# Patient Record
Sex: Female | Born: 1970 | Race: White | Hispanic: No | Smoking: Current every day smoker
Health system: Southern US, Community
[De-identification: ages and names within clinical notes are randomized; demographics above are authoritative.]

## PROBLEM LIST (undated history)

## (undated) DIAGNOSIS — F419 Anxiety disorder, unspecified: Secondary | ICD-10-CM

## (undated) DIAGNOSIS — F988 Other specified behavioral and emotional disorders with onset usually occurring in childhood and adolescence: Secondary | ICD-10-CM

## (undated) DIAGNOSIS — K219 Gastro-esophageal reflux disease without esophagitis: Secondary | ICD-10-CM

## (undated) DIAGNOSIS — Z973 Presence of spectacles and contact lenses: Secondary | ICD-10-CM

## (undated) DIAGNOSIS — F32A Depression, unspecified: Secondary | ICD-10-CM

## (undated) HISTORY — DX: Other specified behavioral and emotional disorders with onset usually occurring in childhood and adolescence: F98.8

## (undated) HISTORY — PX: WISDOM TOOTH EXTRACTION: SHX21

## (undated) HISTORY — DX: Gastro-esophageal reflux disease without esophagitis: K21.9

## (undated) HISTORY — DX: Depression, unspecified: F32.A

## (undated) HISTORY — DX: Anxiety disorder, unspecified: F41.9

---

## 2003-06-09 ENCOUNTER — Other Ambulatory Visit: Admission: RE | Admit: 2003-06-09 | Discharge: 2003-06-09 | Payer: Self-pay | Admitting: Obstetrics and Gynecology

## 2004-04-20 ENCOUNTER — Inpatient Hospital Stay (HOSPITAL_COMMUNITY): Admission: AD | Admit: 2004-04-20 | Discharge: 2004-04-20 | Payer: Self-pay | Admitting: Obstetrics and Gynecology

## 2004-04-21 ENCOUNTER — Observation Stay (HOSPITAL_COMMUNITY): Admission: AD | Admit: 2004-04-21 | Discharge: 2004-04-22 | Payer: Self-pay | Admitting: Obstetrics & Gynecology

## 2004-06-24 ENCOUNTER — Inpatient Hospital Stay (HOSPITAL_COMMUNITY): Admission: AD | Admit: 2004-06-24 | Discharge: 2004-06-28 | Payer: Self-pay | Admitting: Obstetrics and Gynecology

## 2004-08-09 ENCOUNTER — Other Ambulatory Visit: Admission: RE | Admit: 2004-08-09 | Discharge: 2004-08-09 | Payer: Self-pay | Admitting: Obstetrics and Gynecology

## 2005-04-26 ENCOUNTER — Ambulatory Visit: Payer: Self-pay | Admitting: Internal Medicine

## 2005-09-01 ENCOUNTER — Ambulatory Visit: Payer: Self-pay | Admitting: Internal Medicine

## 2006-09-18 ENCOUNTER — Encounter: Payer: Self-pay | Admitting: Internal Medicine

## 2006-10-09 ENCOUNTER — Ambulatory Visit: Payer: Self-pay | Admitting: Internal Medicine

## 2006-10-09 DIAGNOSIS — R609 Edema, unspecified: Secondary | ICD-10-CM | POA: Insufficient documentation

## 2006-10-09 DIAGNOSIS — K219 Gastro-esophageal reflux disease without esophagitis: Secondary | ICD-10-CM | POA: Insufficient documentation

## 2006-10-09 LAB — CONVERTED CEMR LAB
ALT: 17 units/L (ref 0–35)
AST: 16 units/L (ref 0–37)
Albumin: 3.4 g/dL — ABNORMAL LOW (ref 3.5–5.2)
Alkaline Phosphatase: 28 units/L — ABNORMAL LOW (ref 39–117)
BUN: 16 mg/dL (ref 6–23)
Basophils Absolute: 0 10*3/uL (ref 0.0–0.1)
Basophils Relative: 0.3 % (ref 0.0–1.0)
Bilirubin, Direct: 0.2 mg/dL (ref 0.0–0.3)
CO2: 30 meq/L (ref 19–32)
Calcium: 8.8 mg/dL (ref 8.4–10.5)
Chloride: 103 meq/L (ref 96–112)
Creatinine, Ser: 0.9 mg/dL (ref 0.4–1.2)
Eosinophils Absolute: 0.1 10*3/uL (ref 0.0–0.6)
Eosinophils Relative: 1.7 % (ref 0.0–5.0)
GFR calc Af Amer: 91 mL/min
GFR calc non Af Amer: 75 mL/min
Glucose, Bld: 75 mg/dL (ref 70–99)
HCT: 34.4 % — ABNORMAL LOW (ref 36.0–46.0)
Hemoglobin: 12 g/dL (ref 12.0–15.0)
Lymphocytes Relative: 34.8 % (ref 12.0–46.0)
MCHC: 34.9 g/dL (ref 30.0–36.0)
MCV: 89 fL (ref 78.0–100.0)
Monocytes Absolute: 0.3 10*3/uL (ref 0.2–0.7)
Monocytes Relative: 10.5 % (ref 3.0–11.0)
Neutro Abs: 1.8 10*3/uL (ref 1.4–7.7)
Neutrophils Relative %: 52.7 % (ref 43.0–77.0)
Platelets: 149 10*3/uL — ABNORMAL LOW (ref 150–400)
Potassium: 4.3 meq/L (ref 3.5–5.1)
Pro B Natriuretic peptide (BNP): 200 pg/mL — ABNORMAL HIGH (ref 0.0–100.0)
RBC: 3.87 M/uL (ref 3.87–5.11)
RDW: 12.2 % (ref 11.5–14.6)
Sodium: 138 meq/L (ref 135–145)
Total Bilirubin: 0.8 mg/dL (ref 0.3–1.2)
Total Protein: 5.6 g/dL — ABNORMAL LOW (ref 6.0–8.3)
WBC: 3.3 10*3/uL — ABNORMAL LOW (ref 4.5–10.5)

## 2006-10-10 ENCOUNTER — Telehealth (INDEPENDENT_AMBULATORY_CARE_PROVIDER_SITE_OTHER): Payer: Self-pay | Admitting: *Deleted

## 2006-10-11 ENCOUNTER — Telehealth: Payer: Self-pay | Admitting: Internal Medicine

## 2006-10-22 ENCOUNTER — Ambulatory Visit: Payer: Self-pay

## 2006-10-30 ENCOUNTER — Telehealth (INDEPENDENT_AMBULATORY_CARE_PROVIDER_SITE_OTHER): Payer: Self-pay | Admitting: *Deleted

## 2006-11-02 ENCOUNTER — Telehealth: Payer: Self-pay | Admitting: Internal Medicine

## 2007-12-20 ENCOUNTER — Encounter: Payer: Self-pay | Admitting: Internal Medicine

## 2009-02-10 ENCOUNTER — Ambulatory Visit (HOSPITAL_COMMUNITY): Admission: RE | Admit: 2009-02-10 | Discharge: 2009-02-10 | Payer: Self-pay | Admitting: Emergency Medicine

## 2010-08-05 NOTE — H&P (Signed)
NAME:  Kristy [name], Kristy [name] NO.:  [id number]   MEDICAL RECORD NO.:  [id number]          PATIENT TYPE:  INP   LOCATION:  9152                          FACILITY:  [facility]   PHYSICIAN:  Freddy Finner, M.D.   DATE OF BIRTH:  April 20, 1970   DATE OF ADMISSION:  04/21/2004  DATE OF DISCHARGE:                                HISTORY & PHYSICAL   HISTORY OF PRESENT ILLNESS:  The patient is a 40 year old white, married  female gravida 2, para 1 who delivered her first infant at 50 weeks'  gestation by vaginal birth.  [place] was in another city and we do not  have medical records from that hospitalization.  Because of the history, she  has been followed closely for any evidence of preterm contractions or  cervical change and was scanned with ultrasound at 25-1/7 on January 19 with  a finding of cervical length of 2.2 cm and funneling at the internal os.  After careful consultation with the patient at that time and with her  husband, it was elected to start oral terbutaline 2.5 mg every 2-8 hours as  needed for evidence of contractions.  It was elected to start with weekly  progestin injections and weekly examinations in the office.  She has been  followed frequently since that time with no significant change of her cervix  until her visit today after she noticed a marked increase in pelvic pressure  and contractions late in the day on April 20, 2004.  She did have an  ultrasound on that day which showed the cervix to be 1 cm in length with  funneling but clinically her cervix was unchanged to my examination.  She  was given betamethasone 12.5 yesterday, returned to the office today for re-  examination and for repeat betamethasone injection at which time she was  found to be dilated at 1 cm at the internal os.  The overall length of the  cervix was 2 cm on my clinical examination.  The vertex was at a -2 station.  Based on this definite change in cervical exam and her history,  she is now  admitted for further management of preterm labor/preterm cervical change.   REVIEW OF SYSTEMS:  Her current review of systems is negative except as  noted above.  There are no cardiopulmonary, GI, GU complaints.   PAST MEDICAL HISTORY:  Recorded and details in the prenatal summary and will  not be repeated in its entirety.  She does not have history of any  significant medical illnesses and has no known allergies to medications.   FAMILY HISTORY:  Recorded in the prenatal summary and will not be repeated.   PHYSICAL EXAMINATION:  HEENT: Grossly within normal limits.  Thyroid gland  is not palpably enlarged.  VITAL SIGNS: Blood pressure in the office immediately prior to admission was  102/60.  CHEST:  Clear to auscultation.  HEART:  Normal sinus rhythm without murmurs, rubs, or gallops.  ABDOMEN:  Fundal height is 31 cm.  Normal fetal heart tones are heard in the  left lower  quadrant.  PELVIC: Cervix is as noted in present illness.  EXTREMITIES: Without cyanosis, clubbing, or edema.   ASSESSMENT:  1.  Preterm cervical change at 27-2/7 weeks' gestation.  2.  History of preterm delivery at 30 weeks' gestation.   Admission for IV hydration, tocolysis as required for IV antibiotics and for  strict bed rest.      WRN/MEDQ  D:  04/21/2004  T:  04/21/2004  Job:  [id number]

## 2010-08-05 NOTE — Assessment & Plan Note (Signed)
[facility]                                 ON-CALL NOTE   NAME:[name], Kristy                          MRN:          [id number]  DATE:01/16/2007                            DOB:          1971/02/04    TIME OF CALL:  11:04 p.m.   She sees Dr. Cato Mulligan.   TELEPHONE NUMBER:  [id number].   Patient is complaining of headache.  She does not normally get  headaches.  She developed a headache earlier in the day and it has  steadily gotten worse throughout the day.  There has been no recent  trauma.  No other symptoms at all.  She denies any visual problems, no  neurological deficits.  She is mildly nauseated but has not vomited.  She took a 600 mg Motrin 4 hours ago and 2 hours ago took 2 Extra-  Strength Tylenol tablets.  She feels no better and is asking advice.  My  advice is to take another Motrin now, if she does not feel better in the  next hour or so and the headache continues to worsen she should have her  husband drive her to the emergency room.     Tera Mater. Clent Ridges, MD  Electronically Signed    SAF/MedQ  DD: 01/17/2007  DT: 01/17/2007  Job #: [id number]

## 2010-08-05 NOTE — Discharge Summary (Signed)
NAMEJAHNI, PAUL NO.:  [id number]   MEDICAL RECORD NO.:  [id number]          PATIENT TYPE:  INP   LOCATION:  9122                          FACILITY:  [facility]   PHYSICIAN:  Freddy Finner, M.D.   DATE OF BIRTH:  06/29/1970   DATE OF ADMISSION:  06/24/2004  DATE OF DISCHARGE:  06/28/2004                                 DISCHARGE SUMMARY   ADMISSION DIAGNOSES:  1.  Intrauterine pregnancy at 13 1/2 weeks estimated gestational age.  2.  Spontaneous onset of labor.   DISCHARGE DIAGNOSIS:  1.  Status post low transverse cesarean section secondary to failure to      progress.  2.  Viable female infant.   PROCEDURE:  Primary low transverse cesarean section.   REASON FOR ADMISSION:  Please see written H&P.   HOSPITAL COURSE:  The patient is 40 year old married female, gravida 2, para  1 that was admitted to [facility] at36 1/2 weeks with  spontaneous onset of labor. The patient's previous pregnancy had been  complicated by preterm delivery. The patient did progress to complete  dilatation. After approximately two hours of pushing, fetal vertex remained  arrested at a 0 to -1 station.  Despite excellent expulsive efforts by the  patient, a decision was made to proceed with a low transverse cesarean  section. The patient was then transferred to the operating room where  epidural was dosed to an adequate surgical level. A low transverse incision  was made with delivery of a viable female infant weighing 6 pounds 7 ounces,  Apgar's of 9 at 1 minute, 9 at 5 minutes. Umbilical cord pH was 7.33. The  patient tolerated the procedure well and was taken to the recovery room in  stable condition. On postoperative day one, the patient was without  complaint, vital signs were stable and she was afebrile. Abdomen soft with  good return of bowel function. Fundus is firm and nontender. Abdominal  dressing had been removed revealing incision that was clean, dry and  intact.  Laboratory findings revealed hemoglobin 8.5. On postoperative day two, the  patient was without complaint, vital signs were stable, abdomen soft, fundus  was firm and nontender. Incision was clean, dry and intact. Laboratory  findings revealed hemoglobin stable at 8.1. She is ambulating well  tolerating a regular diet without complaints of nausea and vomiting.  On  postoperative day three, the patient did complain of some incisional  soreness right greater than left, baby was jaundiced, uncertain regarding  discharge.  Vital signs were stable. She was afebrile. The fundus is firm  and nontender. Incisions were clean, dry and intact. Staples removed,  instructions were given and the patient was later discharge home.   CONDITION ON DISCHARGE:  Good.   DIET:  Regular as tolerated.   ACTIVITY:  No heavy lifting, no driving x2 weeks, no vaginal entry.   FOLLOW-UP:  The patient is to follow up in the office in 1-2 weeks for an  incision check. She is to call for a temperature greater than 100 degrees,  persistent nausea and  vomiting, heavy vaginal bleeding and/or redness or  drainage from the incisional site.   DISCHARGE MEDICATIONS:  1.  Percocet 5/325, #30, 1 p.o. every 4-h hours p.r.n.  2.  Motrin 600 mg every 6 hours.  3.  Protonix 40 mg,  1 p.o. daily.      CC/MEDQ  D:  07/22/2004  T:  07/22/2004  Job:  [id number]

## 2010-08-05 NOTE — Op Note (Signed)
NAMESHERY, WAUNEKA NO.:  [id number]   MEDICAL RECORD NO.:  [id number]          PATIENT TYPE:  INP   LOCATION:  9122                          FACILITY:  [facility]   PHYSICIAN:  Juluis Mire, M.D.   DATE OF BIRTH:  08/20/1970   DATE OF PROCEDURE:  06/25/2004  DATE OF DISCHARGE:                                 OPERATIVE REPORT   PREOPERATIVE DIAGNOSES:  1.  Intrauterine pregnancy at 36-1/2 weeks.  2.  Failure to descend.   POSTOPERATIVE DIAGNOSES:  1.  Intrauterine pregnancy at 36-1/2 weeks.  2.  Failure to descend.   OPERATIVE PROCEDURE:  Low transverse cesarean section.   SURGEON:  Dr. Arelia Sneddon   ANESTHESIA:  Epidural.   ESTIMATED BLOOD LOSS:  Approximately 600-800 mL.   PACKS AND DRAINS:  None.   INTRAOPERATIVE BLOOD REPLACEMENT:  None.   COMPLICATIONS:  None.   INDICATIONS:  The patient is a 40 year old gravida 2, para 1, married white  female, presents at 36-1/2 weeks with spontaneous onset of labor.  Previous  pregnancy had been a preterm delivery.  The patient progressed to complete  dilatation.  After approximately 2 hours of pushing, the fetal vertex  remained absolutely arrested at a 0 to -1 station with absolutely no descent  with pushing.  This despite excellent expulsive effort by the patient.  Fetal heart rate showed an increasing tachycardia with variable  decelerations.  A diagnosis of failure to descend was made, and the patient  was made ready for a primary cesarean section.  The risks were discussed  including the risk of infection, risk of hemorrhage that could require  transfusion, risk of AIDS or hepatitis, risk of injury to adjacent organs  including bladder, bowel, ureters that could require further exploratory  surgery, risk of deep venous thrombosis and pulmonary embolus.   DESCRIPTION OF PROCEDURE:  The patient taken to the OR and placed in supine  position with left lateral tilt.  After a satisfactory level of epidural  anesthesia was obtained, the abdomen was prepped out with Betadine and  draped as a sterile field.  A low transverse skin incision was made with a  knife and carried through subcutaneous tissue.  The fascia was entered  sharply and the incision extended laterally.  The fascia taken of the muscle  superiorly and inferiorly.  Rectus muscles were separated in the midline.  The peritoneum was entered sharply and the incision in the peritoneum  extended both superiorly and inferiorly.  A low transverse bladder flap was  developed.  A low transverse uterine incision was begun with a knife and  extended laterally using __________ traction.  The infant presented in the  vertex presentation, was slightly OP.  Was delivered with the aid of the  vacuum extractor and fundal pressure.  The infant was a viable female who  weighed 6 pounds 7 ounces.  Apgars were 9/9.  Umbilical cord pH was 7.33.  Placenta was delivered manually.  Uterus was exteriorized for closure.  The  uterus was then closed with running interlocking suture of 0 chromic.  This  was a two layer technique.  We had a slight extension inferiorly on the  right side; however, it did not extend to the bladder.  This was repaired in  the uterine closure.  We did have some bleeding from the uterine incision,  brought in control with figure-of-eights of 0 chromic.  At this point in  time, we had good hemostasis.  Tubes and ovaries were unremarkable.  The  uterus was returned to the abdominal cavity.  We irrigated the pelvis.  We  had good hemostasis.  Urine output again remained clear and adequate.  At  this point in time, muscles in the peritoneum were closed with running  suture of 3-0 Vicryl, fascia closed with a running suture of 0 PDS.  Skin  was closed with staples and Steri-Strips.  Sponge, instrument, and needle  count reported as correct by circulating nurse x 2.  Foley catheter remained  clear at the time of closure.  The patient did  tolerate the procedure well,  was returned to the recovery room in excellent condition.      JSM/MEDQ  D:  06/25/2004  T:  06/25/2004  Job:  [id number]

## 2011-05-01 ENCOUNTER — Telehealth: Payer: Self-pay

## 2011-05-01 NOTE — Telephone Encounter (Signed)
.  [facility] PT WAS TO CALL BACK IF SHE THINK HER MEDS MAY NEED CHANGING PLEASE CALL [phone]   [facility] ON [address]

## 2011-05-02 NOTE — Telephone Encounter (Signed)
CALLED PT LMOM TO C/B

## 2011-05-02 NOTE — Telephone Encounter (Signed)
Patient called back, states that her Wellbutrin XL 300 mg and Celexa 20mg  do not seem to be working as well for her.  She has been on these doses for quite some time, but for the last 2 mths has been "down in the dumps".  Going through a lot, re: divorce, husband getting remarried, etc.  Advised she may need to RTC for med change, but patient does not have insurance.  What can we do to help?  [facility] [address]

## 2011-05-03 NOTE — Telephone Encounter (Signed)
Spoke with patient and advised to come in and discuss at office visit since it has been a while since we last seen her for this. Pt understood and will be coming in to see Dr Cleta Alberts. Also, payment agreement okay for pt.

## 2011-05-03 NOTE — Telephone Encounter (Signed)
Please pull the paper chart.  If she's been seen recently, it is possible that the provider who saw her would be wiling to make some changes by phone initially.  The best thing to do is to come in.  Payment agreement is OK. Delbert Phenix

## 2011-05-08 ENCOUNTER — Ambulatory Visit: Payer: Self-pay | Admitting: Physician Assistant

## 2011-05-08 ENCOUNTER — Other Ambulatory Visit: Payer: Self-pay | Admitting: Emergency Medicine

## 2011-05-08 ENCOUNTER — Encounter: Payer: Self-pay | Admitting: Physician Assistant

## 2011-05-08 VITALS — BP 104/65 | HR 76 | Temp 98.0°F | Resp 16 | Ht 65.0 in | Wt 162.8 lb

## 2011-05-08 DIAGNOSIS — F418 Other specified anxiety disorders: Secondary | ICD-10-CM

## 2011-05-08 DIAGNOSIS — F341 Dysthymic disorder: Secondary | ICD-10-CM

## 2011-05-08 MED ORDER — BUPROPION HCL ER (XL) 300 MG PO TB24: 300.0000 mg | ORAL_TABLET | Freq: Every day | ORAL | Status: DC

## 2011-05-08 MED ORDER — PANTOPRAZOLE SODIUM 40 MG PO TBEC: 40.0000 mg | DELAYED_RELEASE_TABLET | Freq: Every day | ORAL | Status: DC

## 2011-05-08 MED ORDER — CITALOPRAM HYDROBROMIDE 20 MG PO TABS: ORAL_TABLET | ORAL | Status: DC

## 2011-05-08 NOTE — Patient Instructions (Signed)
Increase celexa to 30 mg for 2 weeks.  If that is not effective, increase to 40 mg. Call in 2-3 weeks with status.

## 2011-05-08 NOTE — Progress Notes (Signed)
  Subjective:    Patient ID: Kristy [name], female    DOB: 03-14-1971, 41 y.o.   MRN: [id number]  HPI Ms. Kristy [name] presents today for follow up depression and anxiety.  She has been on Wellbutrin and Celexa for ~ 2 yrs with good results.  However, she states that for the last 2-3 months she has been apathetic and unmotivated which is in contrast to months before.  She denies mood swings or crying spells.  She does get up out of bed everyday and care for her 2 daughters without problelm.  Ms. Kristy [name] states she is engaged but if she doesn't have to do anything she doesn't.  Multiple stressors with ex husband and court dates.  Active in church with her daughters and finds this helpful. No SI.  She also requests RF of Protonix.  She has gained 15 lbs in last 3-5 months and has not been exercising.  She takes Protonix prn but is needing it more lately.  Review of Systems As above    Objective:   Physical Exam HEENT normal PULM CTA bilaterally Heart r/r/r no m/r/g No edema        Assessment & Plan:  Depression/Anxiety GERD  Increase citalopram to 30 mg initially with continuing same dose of Wellbutrin.  If no results after 2-3 weeks at 30 mg, she may increase to 40 mg qd.  She will call me in 3-4 weeks with status. Refill Protonix. Encouraged diet change and exercise.

## 2011-11-14 ENCOUNTER — Other Ambulatory Visit: Payer: Self-pay | Admitting: Physician Assistant

## 2011-11-16 ENCOUNTER — Other Ambulatory Visit: Payer: Self-pay | Admitting: Physician Assistant

## 2011-11-16 NOTE — Telephone Encounter (Signed)
Then needs office visit 

## 2011-11-17 ENCOUNTER — Ambulatory Visit: Payer: Self-pay | Admitting: Physician Assistant

## 2011-11-17 VITALS — BP 99/63 | HR 98 | Temp 98.3°F | Resp 16 | Ht 66.0 in | Wt 154.6 lb

## 2011-11-17 DIAGNOSIS — M778 Other enthesopathies, not elsewhere classified: Secondary | ICD-10-CM

## 2011-11-17 DIAGNOSIS — F419 Anxiety disorder, unspecified: Secondary | ICD-10-CM

## 2011-11-17 DIAGNOSIS — F411 Generalized anxiety disorder: Secondary | ICD-10-CM

## 2011-11-17 MED ORDER — ALPRAZOLAM 0.5 MG PO TABS: 0.5000 mg | ORAL_TABLET | Freq: Every evening | ORAL | Status: DC | PRN

## 2011-11-17 MED ORDER — BUPROPION HCL ER (XL) 300 MG PO TB24: 300.0000 mg | ORAL_TABLET | Freq: Every day | ORAL | Status: DC

## 2011-11-17 MED ORDER — CITALOPRAM HYDROBROMIDE 20 MG PO TABS: 30.0000 mg | ORAL_TABLET | Freq: Every day | ORAL | Status: DC

## 2011-11-17 MED ORDER — MELOXICAM 15 MG PO TABS: 15.0000 mg | ORAL_TABLET | Freq: Every day | ORAL | Status: AC

## 2011-11-17 NOTE — Progress Notes (Signed)
  Subjective:    Patient ID: Kristy [name], female    DOB: 1971-02-24, 41 y.o.   MRN: [id number]  HPI 41 year old female presents for recheck of anxiety and refills of Celexa and Wellbutrin. States she is doing well on these medications and is very happy with her current doses. At last visit she increased Celexa to 30 mg and continued Wellbutrin XL 300 mg daily. She is still living with her parents with her 2 daughters but feels ok about it. 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 Is here mostly for reassurance that it is ok for her to stay on her medications because her family believes she should "get off them," but she is comfortable to stay on them.  Denies any adverse effects or side effects.   She also complains of left ulnar side wrist pain x 2-3 days. Complains that the pain is with lateral bending but not flexion/extension.  She denies paresthesias or weakness. Has taken ibuprofen which has not helped much.    Review of Systems  All other systems reviewed and are negative.       Objective:   Physical Exam  Constitutional: She is oriented to person, place, and time. She appears well-developed and well-nourished.  HENT:  Head: Normocephalic and atraumatic.  Right Ear: External ear normal.  Left Ear: External ear normal.  Eyes: Conjunctivae and EOM are normal.  Neck: Normal range of motion.  Cardiovascular: Normal rate.   Pulmonary/Chest: Effort normal.  Musculoskeletal:       Arms: Neurological: She is alert and oriented to person, place, and time.  Psychiatric: She has a normal mood and affect. Her behavior is normal. Judgment and thought content normal.          Assessment & Plan:   1. Anxiety  Continue current treatment plan. I gave her a year's worth of medication and instructed her to call if she has concerns or wants to change the plan.  buPROPion (WELLBUTRIN XL) 300 MG 24 hr tablet, citalopram  (CELEXA) 20 MG tablet, ALPRAZolam (XANAX) 0.5 MG tablet  2. Tendinitis of wrist  Recommend trial of Mobic 15 mg daily with ice and rest. May try an OTC wrist splint to wear while at work and during the night.  Follow up if no improvement in 1-2 weeks

## 2011-11-24 NOTE — Progress Notes (Signed)
This encounter was created in error - please disregard.

## 2011-12-06 ENCOUNTER — Ambulatory Visit: Payer: Self-pay | Admitting: Physician Assistant

## 2011-12-06 VITALS — BP 120/68 | HR 75 | Temp 98.7°F | Resp 16 | Ht 65.0 in | Wt 155.0 lb

## 2011-12-06 DIAGNOSIS — R21 Rash and other nonspecific skin eruption: Secondary | ICD-10-CM

## 2011-12-06 DIAGNOSIS — T375X5A Adverse effect of antiviral drugs, initial encounter: Secondary | ICD-10-CM

## 2011-12-06 DIAGNOSIS — B029 Zoster without complications: Secondary | ICD-10-CM

## 2011-12-06 DIAGNOSIS — N141 Nephropathy induced by other drugs, medicaments and biological substances: Secondary | ICD-10-CM

## 2011-12-06 MED ORDER — ACYCLOVIR 200 MG PO CAPS: 800.0000 mg | ORAL_CAPSULE | Freq: Every day | ORAL | Status: DC

## 2011-12-06 NOTE — Patient Instructions (Addendum)

## 2011-12-06 NOTE — Progress Notes (Signed)
  Subjective:    Patient ID: Kristy [name], female    DOB: 08-17-1970, 41 y.o.   MRN: [id number]  HPI 41 year old female presents with 5 day history of pain along the left side of her rib cage. States is started as a pain almost like a pulled muscle even though she had no injury.  Does admit that 2 days ago a rash appeared.  Is here because she has been told from friends and family that it looks like it could be shingles.  She is otherwise healthy and has no other concerns today.      Review of Systems  All other systems reviewed and are negative.       Objective:   Physical Exam  Constitutional: She is oriented to person, place, and time. She appears well-developed and well-nourished.  HENT:  Head: Normocephalic and atraumatic.  Right Ear: External ear normal.  Left Ear: External ear normal.  Mouth/Throat: No oropharyngeal exudate.  Eyes: Conjunctivae normal are normal.  Neck: Normal range of motion.  Cardiovascular: Normal rate.   Pulmonary/Chest: Effort normal.  Musculoskeletal: Normal range of motion.  Neurological: She is alert and oriented to person, place, and time.  Psychiatric: She has a normal mood and affect. Her behavior is normal. Judgment and thought content normal.          Assessment & Plan:   1. Shingles  acyclovir (ZOVIRAX) 200 MG capsule  2. Rash and nonspecific skin eruption    3. Acyclovir-induced nephrotoxicity     Patient eduction and counseling provided.  Start acyclovir as directed.  Refill provided in case of another flare.

## 2011-12-07 ENCOUNTER — Telehealth: Payer: Self-pay

## 2011-12-07 MED ORDER — HYDROCODONE-ACETAMINOPHEN 5-500 MG PO TABS: 1.0000 | ORAL_TABLET | Freq: Three times a day (TID) | ORAL | Status: DC | PRN

## 2011-12-07 NOTE — Telephone Encounter (Signed)
Pt LM on nurse VM at 2:22 to request that a pain med ben sent to her pharmacy.

## 2011-12-07 NOTE — Telephone Encounter (Signed)
I have written for some hydrocodone. She can take this every 8 hours prn, but it will make her drowsy so she should not take it if she is going to be driving or going to work.

## 2011-12-07 NOTE — Telephone Encounter (Signed)
PT SAW HEATHER FOR SHINGLES.  WANTS TO KNOW IF SHE CAN TAKE SOMETHING OTHER THAN IBPROPHEN FOR THE PAIN.  PLEASE CALL [phone]

## 2011-12-08 NOTE — Telephone Encounter (Signed)
LMOM for pt that Rx was sent in and gave info about causing drowsiness.

## 2012-02-10 ENCOUNTER — Other Ambulatory Visit: Payer: Self-pay | Admitting: Physician Assistant

## 2012-02-24 ENCOUNTER — Ambulatory Visit: Payer: Self-pay | Admitting: Physician Assistant

## 2012-02-24 VITALS — BP 130/76 | HR 108 | Temp 101.6°F | Resp 16 | Ht 65.0 in | Wt 157.6 lb

## 2012-02-24 DIAGNOSIS — J029 Acute pharyngitis, unspecified: Secondary | ICD-10-CM

## 2012-02-24 DIAGNOSIS — J020 Streptococcal pharyngitis: Secondary | ICD-10-CM

## 2012-02-24 LAB — POCT CBC
Hemoglobin: 12.4 g/dL (ref 12.2–16.2)
MCH, POC: 29.4 pg (ref 27–31.2)
MCHC: 31.6 g/dL — AB (ref 31.8–35.4)
MID (cbc): 0.6 (ref 0–0.9)
POC Granulocyte: 5.5 (ref 2–6.9)
POC MID %: 7.6 %M (ref 0–12)
Platelet Count, POC: 163 10*3/uL (ref 142–424)
RBC: 4.22 M/uL (ref 4.04–5.48)

## 2012-02-24 MED ORDER — AMOXICILLIN 875 MG PO TABS: 875.0000 mg | ORAL_TABLET | Freq: Two times a day (BID) | ORAL | Status: DC

## 2012-02-24 MED ORDER — HYDROCODONE-ACETAMINOPHEN 5-325 MG PO TABS: 1.0000 | ORAL_TABLET | Freq: Four times a day (QID) | ORAL | Status: DC | PRN

## 2012-02-24 NOTE — Progress Notes (Signed)
Patient ID: LADINA SHUTTERS MRN: [id number], DOB: [birth year], 41 y.o. Date of Encounter: 02/24/2012, 4:56 PM  Primary Physician: Judie Petit, MD  Chief Complaint:  Chief Complaint  Patient presents with  . Sore Throat    fever and chills body aches since last night    HPI: 41 y.o. year old female presents with a one day history of sore throat, fever, and headache. T max currently at 101.6. She is having some myalgias and chills. Headache is generalized dull ache. Gradual in onset. Left side of her throat hurts more than right side. No cough, congestion, rhinorrhea, sinus pressure, or otalgia. Normal hearing. She last took an antipyretic this morning. No GI complaints. Able to swallow saliva, but hurts to do so. Decreased appetite secondary to sore throat. No known contacts with strep throat.    Past Medical History  Diagnosis Date  . Anxiety   . GERD (gastroesophageal reflux disease)      Home Meds: Prior to Admission medications   Medication Sig Start Date End Date Taking? Authorizing Provider  buPROPion (WELLBUTRIN XL) 300 MG 24 hr tablet TAKE ONE TABLET BY MOUTH DAILY 11/16/11  Yes Christyne Mccain M Mylinda Brook, PA-C  buPROPion (WELLBUTRIN XL) 300 MG 24 hr tablet Take 1 tablet (300 mg total) by mouth daily. 11/17/11  Yes Heather M Marte, PA-C  citalopram (CELEXA) 20 MG tablet Take 1.5 tablets (30 mg total) by mouth daily. 11/17/11  Yes Heather M Marte, PA-C  JUNEL FE 1/20 1-20 MG-MCG tablet TAKE 1 TABLET BY MOUTH DAILY 02/10/12  Yes Morrell Riddle, PA-C  pantoprazole (PROTONIX) 40 MG tablet Take 1 tablet (40 mg total) by mouth daily. 05/08/11  Yes Pattricia Boss, PA-C  acyclovir (ZOVIRAX) 200 MG capsule Take 4 capsules (800 mg total) by mouth 5 (five) times daily. Take for 7 days 12/06/11   Nelva Nay, PA-C  ALPRAZolam Prudy Feeler) 0.5 MG tablet Take 1 tablet (0.5 mg total) by mouth at bedtime as needed. 11/17/11   Heather Jaquita Rector, PA-C  HYDROcodone-acetaminophen (VICODIN) 5-500 MG per tablet Take  1 tablet by mouth every 8 (eight) hours as needed for pain. 12/07/11   Heather Jaquita Rector, PA-C  meloxicam (MOBIC) 15 MG tablet Take 1 tablet (15 mg total) by mouth daily. 11/17/11 11/16/12  Nelva Nay, PA-C    Allergies: No Known Allergies  History   Social History  . Marital Status: Divorced    Spouse Name: N/A    Number of Children: N/A  . Years of Education: N/A   Occupational History  . Not on file.   Social History Main Topics  . Smoking status: Former Games developer  . Smokeless tobacco: Not on file     Comment: quit 11  years ago  . Alcohol Use: Not on file  . Drug Use: Not on file  . Sexually Active: Not on file   Other Topics Concern  . Not on file   Social History Narrative  . No narrative on file     Review of Systems: Constitutional: negative for night sweats or weight changes HEENT: see above Cardiovascular: negative for chest pain or palpitations Respiratory: negative for hemoptysis, wheezing, or shortness of breath Abdominal: negative for abdominal pain, nausea, vomiting or diarrhea Dermatological: negative for rash   Physical Exam: Blood pressure 130/76, pulse 108, temperature 101.6 F (38.7 C), temperature source Oral, resp. rate 16, height 5\' 5"  (1.651 m), weight 157 lb 9.6 oz (71.487 kg), last menstrual period 02/10/2012, SpO2 99.00%.,  Body mass index is 26.23 kg/(m^2). General: Well developed, well nourished, in no acute distress. Head: Normocephalic, atraumatic, eyes without discharge, sclera non-icteric, nares are patent. Bilateral auditory canals clear, TM's are without perforation, pearly grey with reflective cone of light bilaterally. No sinus TTP. Oral cavity moist, dentition normal. Posterior pharynx erythematous with tonsillar exudate left greater than right. No post nasal drip or peritonsillar abscess. Uvula midline.  Neck: Supple. No thyromegaly. Full ROM. < 2 cm AC bilaterally. Lungs: Clear bilaterally to auscultation without wheezes, rales, or  rhonchi. Breathing is unlabored. Heart: RRR with S1 S2. No murmurs, rubs, or gallops appreciated. Msk:  Strength and tone normal for age. Extremities: No clubbing or cyanosis. No edema. Neuro: Alert and oriented X 3. Moves all extremities spontaneously. CNII-XII grossly in tact. Psych:  Responds to questions appropriately with a normal affect.   Labs: Results for orders placed in visit on 02/24/12  POCT RAPID STREP A (OFFICE)      Component Value Range   Rapid Strep A Screen Negative  Negative  POCT CBC      Component Value Range   WBC 7.5  4.6 - 10.2 K/uL   Lymph, poc 1.4  0.6 - 3.4   POC LYMPH PERCENT 18.9  10 - 50 %L   MID (cbc) 0.6  0 - 0.9   POC MID % 7.6  0 - 12 %M   POC Granulocyte 5.5  2 - 6.9   Granulocyte percent 73.5  37 - 80 %G   RBC 4.22  4.04 - 5.48 M/uL   Hemoglobin 12.4  12.2 - 16.2 g/dL   HCT, POC 16.1  09.6 - 47.9 %   MCV 93.0  80 - 97 fL   MCH, POC 29.4  27 - 31.2 pg   MCHC 31.6 (*) 31.8 - 35.4 g/dL   RDW, POC 04.5     Platelet Count, POC 163  142 - 424 K/uL   MPV    0 - 99.8 fL   Declines TC at this time.  ASSESSMENT AND PLAN:  41 y.o. year old female with likely strep pharyngitis. -Amoxicillin 875 mg 1 po bid #20 no RF, will treat empirically.  -Norco 5/325 mg 1 po q 4-6 hours prn pain #30 no RF, SED -She declines the TC I took this evening. She will call in 2-3 days if she is not feeling better and we may need to reevaluate her.  -Tylenol/Motrin prn -Rest/fluids -RTC precautions -RTC 3-5 days if no improvement  Signed, Eula Listen, PA-C 02/24/2012 4:56 PM

## 2012-02-27 ENCOUNTER — Other Ambulatory Visit: Payer: Self-pay | Admitting: Family Medicine

## 2012-02-27 ENCOUNTER — Telehealth: Payer: Self-pay

## 2012-02-27 ENCOUNTER — Ambulatory Visit: Payer: Self-pay | Admitting: Family Medicine

## 2012-02-27 VITALS — BP 100/73 | HR 97 | Temp 98.2°F | Resp 18 | Ht 65.5 in | Wt 154.0 lb

## 2012-02-27 DIAGNOSIS — B085 Enteroviral vesicular pharyngitis: Secondary | ICD-10-CM

## 2012-02-27 DIAGNOSIS — J039 Acute tonsillitis, unspecified: Secondary | ICD-10-CM

## 2012-02-27 DIAGNOSIS — R599 Enlarged lymph nodes, unspecified: Secondary | ICD-10-CM

## 2012-02-27 DIAGNOSIS — R509 Fever, unspecified: Secondary | ICD-10-CM

## 2012-02-27 DIAGNOSIS — R591 Generalized enlarged lymph nodes: Secondary | ICD-10-CM

## 2012-02-27 LAB — POCT CBC
Granulocyte percent: 58.9 %G (ref 37–80)
HCT, POC: 43.5 % (ref 37.7–47.9)
Hemoglobin: 13.9 g/dL (ref 12.2–16.2)
Lymph, poc: 1.3 (ref 0.6–3.4)
MCV: 92.8 fL (ref 80–97)
POC Granulocyte: 2.4 (ref 2–6.9)
POC LYMPH PERCENT: 32.3 %L (ref 10–50)
RDW, POC: 12.2 %

## 2012-02-27 MED ORDER — DOXYCYCLINE HYCLATE 100 MG PO TABS: 100.0000 mg | ORAL_TABLET | Freq: Two times a day (BID) | ORAL | Status: DC

## 2012-02-27 MED ORDER — CEFTRIAXONE SODIUM 1 G IJ SOLR
1.0000 g | INTRAMUSCULAR | Status: DC
  Administered 2012-02-27: 1 g via INTRAMUSCULAR

## 2012-02-27 MED ORDER — VALACYCLOVIR HCL 1 G PO TABS: ORAL_TABLET | ORAL | Status: DC

## 2012-02-27 MED ORDER — MAGIC MOUTHWASH W/LIDOCAINE: 5.0000 mL | Freq: Four times a day (QID) | ORAL | Status: DC | PRN

## 2012-02-27 NOTE — Progress Notes (Signed)
Subjective:    Patient ID: Kristy [name], female    DOB: 08/12/1970, 41 y.o.   MRN: [id number]  HPI [name] is a 41 y.o. female Seen 3 days ago with fever and sore throat.  Negative rapid strep testing, with normal WBC, and exudative tonsilitis treated empirically with amoxicillin. Declined throat culture at that time. Feels about the same - more blisters and pus on back of throat.  Fever off and on.  tmax 101.7 yesterday. Taking tylenol with hydrocodone, and motrin, and taking amoxicillin.  No new rash.  Decreased appetite.   Having night sweats.   2 daughters - but they have not been well.  Surveyor, minerals. Plays kickball.  New partner past year.  No known exposure to STI's or personal hx of STI's, but has had oral to genital sexual contact.    Review of Systems  Constitutional: Positive for appetite change (hurts to swallow, but able to drink. ).  HENT: Positive for sore throat, mouth sores and trouble swallowing.   Respiratory: Negative for shortness of breath.   Skin: Negative for rash.  Hematological: Positive for adenopathy.       Objective:   Physical Exam  Constitutional: She is oriented to person, place, and time. She appears well-developed and well-nourished. No distress.       Appears uncomfortable, but nontoxic.   HENT:  Head: Normocephalic and atraumatic.  Right Ear: Hearing, tympanic membrane, external ear and ear canal normal.  Left Ear: Hearing, tympanic membrane, external ear and ear canal normal.  Nose: Nose normal.  Mouth/Throat: Uvula is midline. Posterior oropharyngeal erythema present. No oropharyngeal exudate or tonsillar abscesses.         Few scattered vesicles posterior oropharynx, with large exudative/ulcerative appearing L tonsil with tonsilar enlargement. No PTA. Moist mucous membranes.  Eyes: Conjunctivae normal and EOM are normal. Pupils are equal, round, and reactive to light.  Neck: Neck supple.  Cardiovascular: Normal rate, regular rhythm,  normal heart sounds and intact distal pulses.   No murmur heard. Pulmonary/Chest: Effort normal and breath sounds normal. No respiratory distress. She has no wheezes. She has no rhonchi.  Abdominal: Normal appearance. There is no hepatosplenomegaly.  Lymphadenopathy:       Head (right side): Occipital (possible slight enlargement without ttp. ) adenopathy present.    She has cervical adenopathy.       Left cervical: Superficial cervical and posterior cervical adenopathy present.    She has no axillary adenopathy.       Right: No supraclavicular and no epitrochlear adenopathy present.       Left: No supraclavicular and no epitrochlear adenopathy present.  Neurological: She is alert and oriented to person, place, and time.  Skin: Skin is warm and dry. No rash noted.  Psychiatric: She has a normal mood and affect. Her behavior is normal.   Results for orders placed in visit on 02/27/12  POCT CBC      Component Value Range   WBC 4.0 (*) 4.6 - 10.2 K/uL   Lymph, poc 1.3  0.6 - 3.4   POC LYMPH PERCENT 32.3  10 - 50 %L   MID (cbc) 0.4  0 - 0.9   POC MID % 8.8  0 - 12 %M   POC Granulocyte 2.4  2 - 6.9   Granulocyte percent 58.9  37 - 80 %G   RBC 4.69  4.04 - 5.48 M/uL   Hemoglobin 13.9  12.2 - 16.2 g/dL   HCT, POC 43.5  37.7 - 47.9 %   MCV 92.8  80 - 97 fL   MCH, POC 29.6  27 - 31.2 pg   MCHC 32.0  31.8 - 35.4 g/dL   RDW, POC 82.9     Platelet Count, POC 216  142 - 424 K/uL   MPV 10.0  0 - 99.8 fL       Assessment & Plan:  Kristy [name] is a 41 y.o. female 1. Tonsillitis with exudate  cefTRIAXone (ROCEPHIN) injection 1 g, Epstein-Barr virus VCA, IgM, POCT CBC, CMV IgM, Alum & Mag Hydroxide-Simeth (MAGIC MOUTHWASH W/LIDOCAINE) SOLN, doxycycline (VIBRA-TABS) 100 MG tablet, Herpes simplex virus culture, Gonococcus culture  2. Vesicular pharyngitis  Alum & Mag Hydroxide-Simeth (MAGIC MOUTHWASH W/LIDOCAINE) SOLN, valACYclovir (VALTREX) 1000 MG tablet, Herpes simplex virus culture,  Gonococcus culture  3. Fever  cefTRIAXone (ROCEPHIN) injection 1 g, doxycycline (VIBRA-TABS) 100 MG tablet, Herpes simplex virus culture, Gonococcus culture  4. Lymphadenopathy  Epstein-Barr virus VCA, IgM, CMV IgM, Herpes simplex virus culture, Gonococcus culture   Reassuring CBC - viral appearing.  Persistent exudative tonsilitis/ulcerative appearing L tonsil, with posterior vesicular rash concerning for HSV, vs EBV/CMV. Less likely GC/CT but tests as above, and Rocephin given, and rx doxycycline x 1 week in case this were gonococcal in nature.  Sx care as below, and rtc precautions discussed. Recheck in 2 days.  Spleen precautions - out of sport  Patient Instructions  You can stop the amoxicillin at this point, as you received an injection of Rocephin today.  Start the valtrex as prescribed, hydrocodone if needed for pain, and continue ibuprofen to help with pain and swelling.  Cold fluids, lozenges as needed.  Magic mouthwash if needed, and recheck in next 2 days. Return to the clinic or go to the nearest emergency room if any of your symptoms worsen or new symptoms occur. Your should receive a call or letter about your lab results within the next week to 10 days.

## 2012-02-27 NOTE — Telephone Encounter (Signed)
Patient is here now to see Dr Neva Seat.

## 2012-02-27 NOTE — Telephone Encounter (Signed)
Kristy [name]  Patient is not feeling any better, sore throat is worse, more blisters in back of throat.  Not sure the antibiotics are working.   Please call [phone]

## 2012-02-27 NOTE — Patient Instructions (Signed)
You can stop the amoxicillin at this point, as you received an injection of Rocephin today.  Start the valtrex as prescribed, hydrocodone if needed for pain, and continue ibuprofen to help with pain and swelling.  Cold fluids, lozenges as needed.  Magic mouthwash if needed, and recheck in next 2 days. Return to the clinic or go to the nearest emergency room if any of your symptoms worsen or new symptoms occur. Your should receive a call or letter about your lab results within the next week to 10 days.

## 2012-02-28 LAB — EPSTEIN-BARR VIRUS VCA, IGM: EBV VCA IgM: <10 U/mL (ref <36.0)

## 2012-02-29 ENCOUNTER — Ambulatory Visit: Payer: Self-pay | Admitting: Family Medicine

## 2012-02-29 ENCOUNTER — Other Ambulatory Visit: Payer: Self-pay | Admitting: Family Medicine

## 2012-02-29 VITALS — BP 98/68 | HR 77 | Temp 98.0°F | Resp 16 | Ht 65.5 in | Wt 153.4 lb

## 2012-02-29 DIAGNOSIS — B009 Herpesviral infection, unspecified: Secondary | ICD-10-CM

## 2012-02-29 DIAGNOSIS — J029 Acute pharyngitis, unspecified: Secondary | ICD-10-CM

## 2012-02-29 DIAGNOSIS — B085 Enteroviral vesicular pharyngitis: Secondary | ICD-10-CM

## 2012-02-29 LAB — HERPES SIMPLEX VIRUS CULTURE: Organism ID, Bacteria: DETECTED

## 2012-02-29 LAB — CMV IGM: CMV IgM: 0.74 (ref <0.90)

## 2012-02-29 MED ORDER — VALACYCLOVIR HCL 1 G PO TABS: 1.0000 g | ORAL_TABLET | Freq: Two times a day (BID) | ORAL | Status: DC

## 2012-02-29 NOTE — Patient Instructions (Signed)
Restart valtrex - twice per day for 1 week, then if recurrence - 1 per day for 5 days.  If recurs, consider daily dosing of valtrex. Your should receive a call or letter about your lab results within the next week to 10 days.  Return to the clinic or go to the nearest emergency room if any of your symptoms worsen or new symptoms occur. Your partner should be tested for this infection to determine if he was a carrier.  To look up more info on your condition, go to the website [email or web address], then on patient resources - select [facility]. Under patient resources, select herpes virus infection.  You can also check the CDC website for more information.

## 2012-02-29 NOTE — Progress Notes (Signed)
Subjective:    Patient ID: Kristy [name], female    DOB: 06-21-1970, 41 y.o.   MRN: [id number]  HPI Kristy [name] is a 40 y.o. female See 02/24/12, then 02/27/12 office visits with sore throat, exudative/ulcerative tonsilitis 2 days ago, worsening from prior visit. Possible viral illness including HSV, EBV, CMV or GC/CT of oropharynx.  Rocephin 1 gram IM, doxycycline, and Valtrex 2 grams po 12h x 2, magic mouthwash and continued hydrocodone if needed.   Feeling better, still tired - sleeping more.  No fever since last office visit. Finished valtrex - 2grams Q12h x 2.  Appetite better.  Vomited once this am after taking antibiotic - doxycycline.  Has tolerated previous doses of doxy.  Drinking fluids ok.  No hydrocodone since yesterday, but has been using MMW. Slight itching, but no rash. Breathing ok.  No hx of HSV known, no hx of genital ulcers or blisters. New partner past year - no known hx of STI's.   Had std testing with giving blood about a year ago - no known infections at that point.   Results for orders placed in visit on 02/27/12  EPSTEIN-BARR VIRUS VCA, IGM      Component Value Range   EBV VCA IgM <10.0  <36.0 U/mL  POCT CBC      Component Value Range   WBC 4.0 (*) 4.6 - 10.2 K/uL   Lymph, poc 1.3  0.6 - 3.4   POC LYMPH PERCENT 32.3  10 - 50 %L   MID (cbc) 0.4  0 - 0.9   POC MID % 8.8  0 - 12 %M   POC Granulocyte 2.4  2 - 6.9   Granulocyte percent 58.9  37 - 80 %G   RBC 4.69  4.04 - 5.48 M/uL   Hemoglobin 13.9  12.2 - 16.2 g/dL   HCT, POC 13.0  86.5 - 47.9 %   MCV 92.8  80 - 97 fL   MCH, POC 29.6  27 - 31.2 pg   MCHC 32.0  31.8 - 35.4 g/dL   RDW, POC 78.4     Platelet Count, POC 216  142 - 424 K/uL   MPV 10.0  0 - 99.8 fL  CMV IGM      Component Value Range   CMV IgM 0.74  <0.90  HERPES SIMPLEX VIRUS CULTURE      Component Value Range   Organism ID, Bacteria HERPES SIMPLEX VIRUS TYPE 2 DETECTED.    GONOCOCCUS CULTURE      Component Value Range   Preliminary  Report NO GROWTH 1 DAY      Review of Systems  Constitutional: Positive for fatigue. Negative for fever and chills.  HENT: Positive for sore throat and mouth sores.   Skin: Negative for rash.       Itching.        Objective:   Physical Exam  Nursing note and vitals reviewed. Constitutional: She is oriented to person, place, and time. She appears well-developed and well-nourished. No distress.  HENT:  Head: Normocephalic and atraumatic.  Right Ear: Hearing, tympanic membrane, external ear and ear canal normal.  Left Ear: Hearing, tympanic membrane, external ear and ear canal normal.  Nose: Nose normal.  Mouth/Throat: Uvula is midline. Posterior oropharyngeal erythema present. No oropharyngeal exudate or tonsillar abscesses.    Eyes: Conjunctivae normal and EOM are normal. Pupils are equal, round, and reactive to light.  Cardiovascular: Normal rate, regular rhythm, normal heart sounds and intact distal pulses.  No murmur heard. Pulmonary/Chest: Effort normal and breath sounds normal. No respiratory distress. She has no wheezes. She has no rhonchi.  Abdominal: There is no tenderness.  Lymphadenopathy:    She has cervical adenopathy (L sided ac slight enlarged, ttp, no other LAD appreciated. ).  Neurological: She is alert and oriented to person, place, and time.  Skin: Skin is warm and dry. No rash noted.  Psychiatric: She has a normal mood and affect. Her behavior is normal.       Assessment & Plan:  Kristy [name] is a 41 y.o. 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 Pharyngitis    3. Vesicular pharyngitis  valACYclovir (VALTREX) 1000 MG tablet   Discussed HSV 2 as likely cause of ulcerative/vesicular pharyngitis. There labs reviewed in office.  GC/CT cultures still pending, but unlikely source.  Can stop doxy at this point, especially with intolerance.  Restart Valtrex for 7 days after discussion with ID.  Check HIV, RPR.  Partner should be  tested, treated as appropriate, but discussed with patient that either one of them could have been carriers and asymptomatic. If recurrence - has plan to treat and will have leftover Valtrex, but would consider daily meds at that point. rtc precautions.   Patient Instructions  Restart valtrex - twice per day for 1 week, then if recurrence - 1 per day for 5 days.  If recurs, consider daily dosing of valtrex. Your should receive a call or letter about your lab results within the next week to 10 days.  Return to the clinic or go to the nearest emergency room if any of your symptoms worsen or new symptoms occur. Your partner should be tested for this infection to determine if he was a carrier.  To look up more info on your condition, go to the website [email or web address], then on patient resources - select [facility]. Under patient resources, select herpes virus infection.  You can also check the CDC website for more information.

## 2012-03-01 ENCOUNTER — Telehealth: Payer: Self-pay

## 2012-03-01 DIAGNOSIS — B085 Enteroviral vesicular pharyngitis: Secondary | ICD-10-CM

## 2012-03-01 LAB — HIV ANTIBODY (ROUTINE TESTING W REFLEX): HIV: NONREACTIVE

## 2012-03-01 LAB — RPR

## 2012-03-01 LAB — CHLAMYDIA CULTURE

## 2012-03-01 MED ORDER — VALACYCLOVIR HCL 1 G PO TABS: 1.0000 g | ORAL_TABLET | Freq: Two times a day (BID) | ORAL | Status: DC

## 2012-03-01 NOTE — Telephone Encounter (Signed)
See prior message.  New lab results also received - gonorrhea and chlamydia tests also negative.  If any questions, I am in office Saturday morning.

## 2012-03-01 NOTE — Telephone Encounter (Signed)
See phone message and lab message. This number is different than on demographics, I tried calling both and a generic voice mail on both.  Please call pt - clarify concern - what side effects? I pulled up her med record from last night, and the Valtrex was sent to [facility] on [address].  Please advise her also of nonreactive HIV and syphilis tests.

## 2012-03-01 NOTE — Telephone Encounter (Signed)
Patient was calling back regarding a message she received about her labs. Nobody was able to take her call at the time, please call her back at [phone]

## 2012-03-01 NOTE — Telephone Encounter (Signed)
Pt was in last night and says that her rx that we prescribed was not at the pharmacy, and also she is having some side effect she would like to discuss as well. [phone]

## 2012-03-02 NOTE — Telephone Encounter (Signed)
Left message on cell number listed for patient to return call. Also called the home number and spoke with her mother and left message with her. 

## 2012-03-02 NOTE — Telephone Encounter (Signed)
Patient returned call and she said she was itching all over and a lot. She was notified of lab results.

## 2012-03-02 NOTE — Telephone Encounter (Signed)
Just got Valtrex filled today, itching resolved yesterday. Had stopped doxycycline 2 days. No itching yet. Possibly due to rocephin.  Doubt valtrex, and less likely doxycycline, and may have been other cause than meds. As only had itching, could try these again in the future, but advised to keep record of rocephin and doxy as possible allergies only.  rtc if itching recurs on valtrex.

## 2012-03-02 NOTE — Telephone Encounter (Signed)
Left message on cell number listed for patient to return call. Also called the home number and spoke with her mother and left message with her.

## 2012-12-23 ENCOUNTER — Other Ambulatory Visit: Payer: Self-pay

## 2012-12-23 DIAGNOSIS — F419 Anxiety disorder, unspecified: Secondary | ICD-10-CM

## 2012-12-23 MED ORDER — BUPROPION HCL ER (XL) 300 MG PO TB24: 300.0000 mg | ORAL_TABLET | Freq: Every day | ORAL | Status: DC

## 2013-11-17 ENCOUNTER — Ambulatory Visit (INDEPENDENT_AMBULATORY_CARE_PROVIDER_SITE_OTHER): Payer: 59 | Admitting: Family Medicine

## 2013-11-17 VITALS — BP 110/84 | HR 75 | Temp 98.1°F | Resp 18 | Ht 64.5 in | Wt 153.8 lb

## 2013-11-17 DIAGNOSIS — M65839 Other synovitis and tenosynovitis, unspecified forearm: Secondary | ICD-10-CM

## 2013-11-17 DIAGNOSIS — R059 Cough, unspecified: Secondary | ICD-10-CM

## 2013-11-17 DIAGNOSIS — M65849 Other synovitis and tenosynovitis, unspecified hand: Secondary | ICD-10-CM

## 2013-11-17 DIAGNOSIS — M778 Other enthesopathies, not elsewhere classified: Secondary | ICD-10-CM

## 2013-11-17 DIAGNOSIS — J209 Acute bronchitis, unspecified: Secondary | ICD-10-CM

## 2013-11-17 DIAGNOSIS — R05 Cough: Secondary | ICD-10-CM

## 2013-11-17 MED ORDER — HYDROCOD POLST-CHLORPHEN POLST 10-8 MG/5ML PO LQCR: 5.0000 mL | Freq: Every evening | ORAL | Status: DC | PRN

## 2013-11-17 MED ORDER — PREDNISONE 20 MG PO TABS: 20.0000 mg | ORAL_TABLET | Freq: Two times a day (BID) | ORAL | Status: DC

## 2013-11-17 NOTE — Progress Notes (Signed)
   Subjective:    Patient ID: Kristy [name], female    DOB: [birth year], 43 y.o.   MRN: [id number]  HPI Patient presents today with 1 1/2 week dry, cough. She has taken mucinex severe cough and cold without long lasting relief. She has also used an old albuterol inhaler with short term relief. Felt tired, but thinks this is due to disrupted sleep. Some muscle aches.   Patient paints houses for living and has noticed that her right wrist is sore and feels weak. She only brings this up today because she is here with another complaint. No numbness or tingling in hand. Uses both a brush and roller. Has been painting for 5 years without having symptoms. Does not recall injury to wrist. Is able to do her job, but has pain. She has not tried anything for pain/inflammation. No pain in left wrist.    Review of Systems No fever/chills, - ear pain, + temporal headache, little post nasal drainage, no colored drainage/sputum. + sore throat.    Objective:   Physical Exam  Vitals reviewed. Constitutional: She is oriented to person, place, and time. She appears well-developed and well-nourished. No distress.  HENT:  Head: Normocephalic and atraumatic.  Right Ear: Tympanic membrane, external ear and ear canal normal.  Left Ear: Tympanic membrane, external ear and ear canal normal.  Nose: Nose normal.  Mouth/Throat: Oropharynx is clear and moist.  Eyes: Conjunctivae and EOM are normal.  Neck: Normal range of motion. Neck supple.  Cardiovascular: Normal rate, regular rhythm and normal heart sounds.   Pulmonary/Chest: Effort normal and breath sounds normal.  Musculoskeletal:       Right wrist: She exhibits tenderness and bony tenderness. She exhibits normal range of motion, no swelling, no effusion, no crepitus, no deformity and no laceration.  Right wrist with full ROM, good strength against resistance, pain with radial and ulnar deviation, no pain, swelling or erythema of joint spaces.  Neurological:  She is alert and oriented to person, place, and time.  Skin: Skin is warm and dry. She is not diaphoretic.  Psychiatric: She has a normal mood and affect. Her behavior is normal. Judgment and thought content normal.      Assessment & Plan:  1. Acute bronchitis, unspecified organism - likely viral in this otherwise healthy patient - predniSONE (DELTASONE) 20 MG tablet; Take 1 tablet (20 mg total) by mouth 2 (two) times daily with a meal.  Dispense: 10 tablet; Refill: 0 - Provided written and verbal information regarding diagnosis and treatment. - RTC if no improvement in 4-5 days, sooner if worsening or if fever/SOB/chest pain  2. Cough - chlorpheniramine-HYDROcodone (TUSSIONEX PENNKINETIC ER) 10-8 MG/5ML LQCR; Take 5 mLs by mouth at bedtime as needed.  Dispense: 70 mL; Refill: 0  3. Right wrist tendonitis -discussed conservative treatment then Xray if no improvement in 2-3 weeks -thumb spica splint applied and patient instructed on use. -OTC NSAIDs once finished with prednisone  Emi Belfast, FNP-BC  [facility], [facility]  11/19/2013 9:17 PM

## 2013-11-17 NOTE — Patient Instructions (Signed)
Cough, Adult  A cough is a reflex that helps clear your throat and airways. It can help heal the body or may be a reaction to an irritated airway. A cough may only last 2 or 3 weeks (acute) or may last more than 8 weeks (chronic).  CAUSES Acute cough:  Viral or bacterial infections. Chronic cough:  Infections.  Allergies.  Asthma.  Post-nasal drip.  Smoking.  Heartburn or acid reflux.  Some medicines.  Chronic lung problems (COPD).  Cancer. SYMPTOMS   Cough.  Fever.  Chest pain.  Increased breathing rate.  High-pitched whistling sound when breathing (wheezing).  Colored mucus that you cough up (sputum). TREATMENT   A bacterial cough may be treated with antibiotic medicine.  A viral cough must run its course and will not respond to antibiotics.  Your caregiver may recommend other treatments if you have a chronic cough. HOME CARE INSTRUCTIONS   Only take over-the-counter or prescription medicines for pain, discomfort, or fever as directed by your caregiver. Use cough suppressants only as directed by your caregiver.  Use a cold steam vaporizer or humidifier in your bedroom or home to help loosen secretions.  Sleep in a semi-upright position if your cough is worse at night.  Rest as needed.  Stop smoking if you smoke. SEEK IMMEDIATE MEDICAL CARE IF:   You have pus in your sputum.  Your cough starts to worsen.  You cannot control your cough with suppressants and are losing sleep.  You begin coughing up blood.  You have difficulty breathing.  You develop pain which is getting worse or is uncontrolled with medicine.  You have a fever. MAKE SURE YOU:   Understand these instructions.  Will watch your condition.  Will get help right away if you are not doing well or get worse. Document Released: 09/02/2010 Document Revised: 05/29/2011 Document Reviewed: 09/02/2010 Exit[employer] Patient Information 2015 [employer]. This information is not intended  to replace advice given to you by your health care provider. Make sure you discuss any questions you have with your health care provider.  

## 2014-01-21 ENCOUNTER — Ambulatory Visit (INDEPENDENT_AMBULATORY_CARE_PROVIDER_SITE_OTHER): Payer: 59 | Admitting: Family Medicine

## 2014-01-21 VITALS — BP 116/68 | HR 89 | Temp 98.4°F | Resp 17 | Ht 65.5 in | Wt 155.0 lb

## 2014-01-21 DIAGNOSIS — M546 Pain in thoracic spine: Secondary | ICD-10-CM

## 2014-01-21 MED ORDER — METHOCARBAMOL 500 MG PO TABS: 500.0000 mg | ORAL_TABLET | Freq: Four times a day (QID) | ORAL | Status: DC | PRN

## 2014-01-21 NOTE — Patient Instructions (Signed)
Use the robaxin as needed (muscle relaxer) but remember it can make you feel sleepy. You might also try heat or massage. Your cough syrup is very good for pain as well- don't combine it with robaxin however Let me know if you do not feel better in the next few days!

## 2014-01-21 NOTE — Progress Notes (Signed)
[facility] [address], [place] [id number] [phone]  Date:  01/21/2014   Name:  Kristy [name]   DOB:  06-23-1970   MRN:  [id number]  PCP:  Donnetta HailABBS,JOHN, MD    Chief Complaint: Back Pain   History of Present Illness:  Kristy [name] is a 43 y.o. very pleasant female patient who presents with the following:  Today is Wednesday. On Saturday she injured her back- this has gotten worse over the last days.  It is worse with prolonged sitting or laying down.  The pain is on the right, in the mid back. On Saturday she was helping to carry a TV down some steps. She did not notice any acute injury or pain but noted that the next day her back felt sore.    No radiation of pain down her right leg, no weakness or numbness or bowel/ bladder control issues.  She has not had this in the past She has been on wellbutrin and celexa, and is now tapering off celexa and on depakote ER for bipolar disorder.  She was afraid to take anything that could interact with her other medications and was not sure about OTC medications Her psychiatrist is Dr. Tomasa Randunningham.    Her husband is s/p vasectomy.  She is also on OCP She is otherwise generally healthy  Patient Active Problem List   Diagnosis Date Noted  . Depression with anxiety 05/08/2011  . GERD 10/09/2006  . SYMPTOM, EDEMA 10/09/2006    Past Medical History  Diagnosis Date  . Anxiety   . GERD (gastroesophageal reflux disease)     No past surgical history on file.  History  Substance Use Topics  . Smoking status: Former Games developermoker  . Smokeless tobacco: Not on file     Comment: quit 11  years ago  . Alcohol Use: Not on file    Family History  Problem Relation Age of Onset  . Hyperlipidemia Mother   . Diabetes Father     No Known Allergies  Medication list has been reviewed and updated.  Current Outpatient Prescriptions on File Prior to Visit  Medication Sig Dispense Refill  . buPROPion (WELLBUTRIN XL) 300  MG 24 hr tablet Take 1 tablet (300 mg total) by mouth daily. PATIENT NEEDS OFFICE VISIT FOR ADDITIONAL REFILLS 30 tablet 0  . chlorpheniramine-HYDROcodone (TUSSIONEX PENNKINETIC ER) 10-8 MG/5ML LQCR Take 5 mLs by mouth at bedtime as needed. 70 mL 0  . citalopram (CELEXA) 20 MG tablet Take 1.5 tablets (30 mg total) by mouth daily. (Patient taking differently: Take 10 mg by mouth daily. ) 90 tablet 3  . JUNEL FE 1/20 1-20 MG-MCG tablet TAKE 1 TABLET BY MOUTH DAILY 1 each 0  . pantoprazole (PROTONIX) 40 MG tablet Take 1 tablet (40 mg total) by mouth daily. 90 tablet 1   No current facility-administered medications on file prior to visit.    Review of Systems:  As per HPI- otherwise negative.   Physical Examination: Filed Vitals:   01/21/14 1309  BP: 116/68  Pulse: 89  Temp: 98.4 F (36.9 C)  Resp: 17   Filed Vitals:   01/21/14 1309  Height: 5' 5.5" (1.664 m)  Weight: 155 lb (70.308 kg)   Body mass index is 25.39 kg/(m^2). Ideal Body Weight: Weight in (lb) to have BMI = 25: 152.2  GEN: WDWN, NAD, Non-toxic, A & O x 3, looks well HEENT: Atraumatic, Normocephalic. Neck supple. No masses, No LAD. Ears and  Nose: No external deformity. CV: RRR, No M/G/R. No JVD. No thrill. No extra heart sounds. PULM: CTA B, no wheezes, crackles, rhonchi. No retractions. No resp. distress. No accessory muscle use. EXTR: No c/c/e NEURO Normal gait.  PSYCH: Normally interactive. Conversant. Not depressed or anxious appearing.  Calm demeanor.  Right thoracic muscles- mild tenderness to palpation and with resisted flexion of the right arm.  Otherwise normal back exam, normal BLE and BUE strength, sensation and DTR.  Normal back flexion.     Assessment and Plan: Right-sided thoracic back pain - Plan: methocarbamol (ROBAXIN) 500 MG tablet  Treat for muscle pain with robaxin as needed.  She also has some leftover tussionex that she can use for more severe pain at night.  Discussed potential risk with  NSIAD medications- she can likely use the in moderation without serious danger.  Also discussed other potential medication interactions  Signed Abbe AmsterdamJessica Copland, MD

## 2014-06-19 ENCOUNTER — Encounter (HOSPITAL_BASED_OUTPATIENT_CLINIC_OR_DEPARTMENT_OTHER): Payer: Self-pay | Admitting: *Deleted

## 2014-06-19 NOTE — Progress Notes (Signed)
No labs needed

## 2014-06-23 ENCOUNTER — Ambulatory Visit: Payer: Self-pay | Admitting: Physician Assistant

## 2014-06-23 NOTE — H&P (Signed)
Kristy [name] is an 44 y.o. female.   Chief Complaint: Left shoulder adhesive capsulitis HPI: : Patient is a 44 year-old female who we had seen back in late January.  In the interim she had done an MRI for continued pain following an injection and p.o. medications.  She has been doing physical therapy as well.  Last note states she is actually regressing with her range of motion.  Increasing pain.    Past Medical History  Diagnosis Date  . Anxiety   . GERD (gastroesophageal reflux disease)   . Wears contact lenses     Past Surgical History  Procedure Laterality Date  . Cesarean section    . Wisdom tooth extraction      Family History  Problem Relation Age of Onset  . Hyperlipidemia Mother   . Diabetes Father    Social History:  reports that she quit smoking about 16 years ago. She does not have any smokeless tobacco history on file. She reports that she drinks alcohol. She reports that she does not use illicit drugs.  Allergies: No Known Allergies   (Not in a hospital admission)  No results found for this or any previous visit (from the past 48 hour(s)). No results found.  Review of Systems  Musculoskeletal: Positive for myalgias, joint pain and neck pain. Negative for falls.  All other systems reviewed and are negative.   Last menstrual period 06/05/2014. Physical Exam  Constitutional: She is oriented to person, place, and time. She appears well-developed and well-nourished. No distress.  HENT:  Head: Normocephalic and atraumatic.  Nose: Nose normal.  Eyes: Conjunctivae and EOM are normal. Pupils are equal, round, and reactive to light.  Neck: Normal range of motion. Neck supple.  Cardiovascular: Normal rate and intact distal pulses.   Respiratory: Effort normal. No respiratory distress. She has no wheezes.  GI: Soft. She exhibits no distension. There is no tenderness.  Musculoskeletal:       Left shoulder: She exhibits decreased range of motion, tenderness, bony  tenderness and pain.  Neurological: She is alert and oriented to person, place, and time. No cranial nerve deficit.  Skin: Skin is warm and dry. No rash noted. No erythema.  Psychiatric: She has a normal mood and affect. Her behavior is normal.     Assessment/Plan Left shoulder adhesive capsulitis  Failing conservative treatments.  Regressing with her motion overall.  Scan was negative for tear, it did show capsular changes consistent with adhesive capsulitis.  We discussed the risks and benefits of outpatient surgery, manipulation, lysis of adhesions, debridement, possible acromioplasty and distal clavicle resection.  Patient wishes to proceed.  This will be scheduled at her convenience.  We will plan on early physical therapy following the surgery.  Given prescriptions today for Percocet and Robaxin.  Patient co-evaluated with Dr. Madelon Lipsaffrey today who is in agreement with the treatment plan  Margart SicklesChadwell, Jdyn Parkerson 06/23/2014, 1:34 PM

## 2014-06-24 ENCOUNTER — Ambulatory Visit (HOSPITAL_BASED_OUTPATIENT_CLINIC_OR_DEPARTMENT_OTHER): Payer: 59 | Admitting: Anesthesiology

## 2014-06-24 ENCOUNTER — Encounter (HOSPITAL_BASED_OUTPATIENT_CLINIC_OR_DEPARTMENT_OTHER): Payer: Self-pay

## 2014-06-24 ENCOUNTER — Ambulatory Visit (HOSPITAL_BASED_OUTPATIENT_CLINIC_OR_DEPARTMENT_OTHER)
Admission: RE | Admit: 2014-06-24 | Discharge: 2014-06-24 | Disposition: A | Discharge status: Home / Self Care | Payer: 59 | Source: Ambulatory Visit | Attending: Orthopedic Surgery | Admitting: Orthopedic Surgery

## 2014-06-24 ENCOUNTER — Encounter (HOSPITAL_BASED_OUTPATIENT_CLINIC_OR_DEPARTMENT_OTHER)
Admission: RE | Disposition: A | Discharge status: Home / Self Care | Payer: Self-pay | Source: Ambulatory Visit | Attending: Orthopedic Surgery

## 2014-06-24 DIAGNOSIS — X58XXXA Exposure to other specified factors, initial encounter: Secondary | ICD-10-CM | POA: Diagnosis not present

## 2014-06-24 DIAGNOSIS — Y939 Activity, unspecified: Secondary | ICD-10-CM | POA: Diagnosis not present

## 2014-06-24 DIAGNOSIS — Y999 Unspecified external cause status: Secondary | ICD-10-CM | POA: Insufficient documentation

## 2014-06-24 DIAGNOSIS — Y929 Unspecified place or not applicable: Secondary | ICD-10-CM | POA: Diagnosis not present

## 2014-06-24 DIAGNOSIS — S43432A Superior glenoid labrum lesion of left shoulder, initial encounter: Secondary | ICD-10-CM | POA: Insufficient documentation

## 2014-06-24 DIAGNOSIS — M25812 Other specified joint disorders, left shoulder: Secondary | ICD-10-CM | POA: Diagnosis not present

## 2014-06-24 DIAGNOSIS — K219 Gastro-esophageal reflux disease without esophagitis: Secondary | ICD-10-CM | POA: Diagnosis not present

## 2014-06-24 DIAGNOSIS — Z87891 Personal history of nicotine dependence: Secondary | ICD-10-CM | POA: Insufficient documentation

## 2014-06-24 DIAGNOSIS — M7502 Adhesive capsulitis of left shoulder: Secondary | ICD-10-CM | POA: Diagnosis not present

## 2014-06-24 DIAGNOSIS — M25512 Pain in left shoulder: Secondary | ICD-10-CM | POA: Diagnosis present

## 2014-06-24 HISTORY — PX: SHOULDER ARTHROSCOPY: SHX128

## 2014-06-24 HISTORY — DX: Presence of spectacles and contact lenses: Z97.3

## 2014-06-24 HISTORY — PX: SHOULDER ACROMIOPLASTY: SHX6093

## 2014-06-24 LAB — POCT HEMOGLOBIN-HEMACUE: Hemoglobin: 12.2 g/dL (ref 12.0–15.0)

## 2014-06-24 SURGERY — ARTHROSCOPY, SHOULDER
Anesthesia: Regional | Site: Shoulder | Laterality: Left

## 2014-06-24 MED ORDER — SODIUM CHLORIDE 0.9 % IV SOLN: INTRAVENOUS | Status: DC

## 2014-06-24 MED ORDER — LIDOCAINE HCL (CARDIAC) 20 MG/ML IV SOLN
INTRAVENOUS | Status: DC | PRN
  Administered 2014-06-24: 80 mg via INTRAVENOUS

## 2014-06-24 MED ORDER — MIDAZOLAM HCL 2 MG/2ML IJ SOLN
1.0000 mg | INTRAMUSCULAR | Status: DC | PRN
  Administered 2014-06-24: 2 mg via INTRAVENOUS

## 2014-06-24 MED ORDER — ONDANSETRON HCL 4 MG PO TABS: 4.0000 mg | ORAL_TABLET | Freq: Four times a day (QID) | ORAL | Status: DC | PRN

## 2014-06-24 MED ORDER — METOCLOPRAMIDE HCL 5 MG PO TABS: 5.0000 mg | ORAL_TABLET | Freq: Three times a day (TID) | ORAL | Status: DC | PRN

## 2014-06-24 MED ORDER — LACTATED RINGERS IV SOLN
INTRAVENOUS | Status: DC
  Administered 2014-06-24: 09:00:00 via INTRAVENOUS

## 2014-06-24 MED ORDER — FENTANYL CITRATE 0.05 MG/ML IJ SOLN
50.0000 ug | INTRAMUSCULAR | Status: DC | PRN
  Administered 2014-06-24: 75 ug via INTRAVENOUS

## 2014-06-24 MED ORDER — METOCLOPRAMIDE HCL 5 MG/ML IJ SOLN
5.0000 mg | Freq: Three times a day (TID) | INTRAMUSCULAR | Status: DC | PRN
Start: 2014-06-24 — End: 2014-06-24

## 2014-06-24 MED ORDER — CEFAZOLIN SODIUM-DEXTROSE 2-3 GM-% IV SOLR
INTRAVENOUS | Status: AC
  Filled 2014-06-24: qty 50

## 2014-06-24 MED ORDER — ONDANSETRON HCL 4 MG/2ML IJ SOLN: 4.0000 mg | Freq: Four times a day (QID) | INTRAMUSCULAR | Status: DC | PRN

## 2014-06-24 MED ORDER — DEXAMETHASONE SODIUM PHOSPHATE 4 MG/ML IJ SOLN
INTRAMUSCULAR | Status: DC | PRN
  Administered 2014-06-24: 10 mg via INTRAVENOUS

## 2014-06-24 MED ORDER — DOCUSATE SODIUM 100 MG PO CAPS: 100.0000 mg | ORAL_CAPSULE | Freq: Two times a day (BID) | ORAL | Status: DC

## 2014-06-24 MED ORDER — OXYCODONE-ACETAMINOPHEN 5-325 MG PO TABS: 1.0000 | ORAL_TABLET | ORAL | Status: DC | PRN

## 2014-06-24 MED ORDER — PROPOFOL 10 MG/ML IV BOLUS
INTRAVENOUS | Status: DC | PRN
  Administered 2014-06-24: 150 mg via INTRAVENOUS

## 2014-06-24 MED ORDER — MIDAZOLAM HCL 2 MG/2ML IJ SOLN
INTRAMUSCULAR | Status: AC
  Filled 2014-06-24: qty 2

## 2014-06-24 MED ORDER — CEFAZOLIN SODIUM-DEXTROSE 2-3 GM-% IV SOLR
2.0000 g | INTRAVENOUS | Status: AC
  Administered 2014-06-24: 2 g via INTRAVENOUS

## 2014-06-24 MED ORDER — METHOCARBAMOL 500 MG PO TABS: 500.0000 mg | ORAL_TABLET | Freq: Four times a day (QID) | ORAL | Status: DC | PRN

## 2014-06-24 MED ORDER — METHOCARBAMOL 1000 MG/10ML IJ SOLN: 500.0000 mg | Freq: Four times a day (QID) | INTRAVENOUS | Status: DC | PRN

## 2014-06-24 MED ORDER — FENTANYL CITRATE 0.05 MG/ML IJ SOLN
INTRAMUSCULAR | Status: AC
  Filled 2014-06-24: qty 2

## 2014-06-24 MED ORDER — SUCCINYLCHOLINE CHLORIDE 20 MG/ML IJ SOLN
INTRAMUSCULAR | Status: DC | PRN
  Administered 2014-06-24: 100 mg via INTRAVENOUS

## 2014-06-24 MED ORDER — LIDOCAINE HCL 4 % MT SOLN
OROMUCOSAL | Status: DC | PRN
  Administered 2014-06-24: 3 mL via TOPICAL

## 2014-06-24 MED ORDER — ONDANSETRON HCL 4 MG/2ML IJ SOLN
INTRAMUSCULAR | Status: DC | PRN
  Administered 2014-06-24: 4 mg via INTRAVENOUS

## 2014-06-24 MED ORDER — CHLORHEXIDINE GLUCONATE 4 % EX LIQD: 60.0000 mL | Freq: Once | CUTANEOUS | Status: DC

## 2014-06-24 MED ORDER — ROPIVACAINE HCL 5 MG/ML IJ SOLN
INTRAMUSCULAR | Status: DC | PRN
  Administered 2014-06-24: 25 mL via PERINEURAL

## 2014-06-24 MED ORDER — HYDROMORPHONE HCL 1 MG/ML IJ SOLN: 0.5000 mg | INTRAMUSCULAR | Status: DC | PRN

## 2014-06-24 SURGICAL SUPPLY — 81 items
BENZOIN TINCTURE PRP APPL 2/3 (GAUZE/BANDAGES/DRESSINGS) IMPLANT
BLADE 4.2CUDA (BLADE) ×3 IMPLANT
BLADE AVERAGE 25MMX9MM (BLADE)
BLADE AVERAGE 25X9 (BLADE) IMPLANT
BLADE CUTTER GATOR 3.5 (BLADE) IMPLANT
BLADE SURG 10 STRL SS (BLADE) IMPLANT
BLADE SURG 15 STRL LF DISP TIS (BLADE) IMPLANT
BLADE SURG 15 STRL SS (BLADE)
BLADE VORTEX 6.0 (BLADE) IMPLANT
BUR 3.5 LG SPHERICAL (BURR) IMPLANT
BUR EGG 3PK/BX (BURR) IMPLANT
BUR OVAL 4.0 (BURR) IMPLANT
BUR OVAL 6.0 (BURR) ×3 IMPLANT
BUR VERTEX HOODED 4.5 (BURR) IMPLANT
BURR 3.5 LG SPHERICAL (BURR)
BURR 3.5MM LG SPHERICAL (BURR)
CANNULA SHOULDER 7CM (CANNULA) ×3 IMPLANT
CLEANER CAUTERY TIP 5X5 PAD (MISCELLANEOUS) IMPLANT
CLOSURE WOUND 1/2 X4 (GAUZE/BANDAGES/DRESSINGS)
CUTTER MENISCUS  4.2MM (BLADE)
CUTTER MENISCUS 4.2MM (BLADE) IMPLANT
DECANTER SPIKE VIAL GLASS SM (MISCELLANEOUS) IMPLANT
DRAPE STERI 35X30 U-POUCH (DRAPES) ×3 IMPLANT
DRAPE SURG 17X23 STRL (DRAPES) ×3 IMPLANT
DRAPE U-SHAPE 76X120 STRL (DRAPES) ×6 IMPLANT
DRSG EMULSION OIL 3X3 NADH (GAUZE/BANDAGES/DRESSINGS) IMPLANT
DRSG PAD ABDOMINAL 8X10 ST (GAUZE/BANDAGES/DRESSINGS) ×3 IMPLANT
DURAPREP 26ML APPLICATOR (WOUND CARE) ×3 IMPLANT
ELECT REM PT RETURN 9FT ADLT (ELECTROSURGICAL) ×3
ELECTRODE REM PT RTRN 9FT ADLT (ELECTROSURGICAL) ×1 IMPLANT
GAUZE SPONGE 4X4 12PLY STRL (GAUZE/BANDAGES/DRESSINGS) ×3 IMPLANT
GLOVE BIO SURGEON STRL SZ7.5 (GLOVE) IMPLANT
GLOVE BIOGEL PI IND STRL 7.0 (GLOVE) ×1 IMPLANT
GLOVE BIOGEL PI IND STRL 8 (GLOVE) ×1 IMPLANT
GLOVE BIOGEL PI IND STRL 8.5 (GLOVE) IMPLANT
GLOVE BIOGEL PI INDICATOR 7.0 (GLOVE) ×2
GLOVE BIOGEL PI INDICATOR 8 (GLOVE) ×2
GLOVE BIOGEL PI INDICATOR 8.5 (GLOVE)
GLOVE ECLIPSE 6.5 STRL STRAW (GLOVE) ×3 IMPLANT
GLOVE SURG ORTHO 8.0 STRL STRW (GLOVE) ×3 IMPLANT
GOWN STRL REUS W/ TWL LRG LVL3 (GOWN DISPOSABLE) IMPLANT
GOWN STRL REUS W/ TWL XL LVL3 (GOWN DISPOSABLE) ×1 IMPLANT
GOWN STRL REUS W/TWL LRG LVL3 (GOWN DISPOSABLE)
GOWN STRL REUS W/TWL XL LVL3 (GOWN DISPOSABLE) ×2
MANIFOLD NEPTUNE II (INSTRUMENTS) ×3 IMPLANT
NEEDLE 1/2 CIR CATGUT .05X1.09 (NEEDLE) IMPLANT
NS IRRIG 1000ML POUR BTL (IV SOLUTION) ×3 IMPLANT
PACK ARTHROSCOPY DSU (CUSTOM PROCEDURE TRAY) ×3 IMPLANT
PACK BASIN DAY SURGERY FS (CUSTOM PROCEDURE TRAY) ×3 IMPLANT
PAD CLEANER CAUTERY TIP 5X5 (MISCELLANEOUS)
PAD ORTHO SHOULDER 7X19 LRG (SOFTGOODS) IMPLANT
PENCIL BUTTON HOLSTER BLD 10FT (ELECTRODE) IMPLANT
SET ARTHROSCOPY TUBING (MISCELLANEOUS) ×2
SET ARTHROSCOPY TUBING LN (MISCELLANEOUS) ×1 IMPLANT
SLEEVE SCD COMPRESS KNEE MED (MISCELLANEOUS) ×3 IMPLANT
SLING ARM LRG ADULT FOAM STRAP (SOFTGOODS) IMPLANT
SLING ARM MED ADULT FOAM STRAP (SOFTGOODS) IMPLANT
SLING ULTRA II MEDIUM (SOFTGOODS) IMPLANT
SLING ULTRA II SMALL (SOFTGOODS) IMPLANT
SPONGE LAP 4X18 X RAY DECT (DISPOSABLE) IMPLANT
STAPLER VISISTAT 35W (STAPLE) IMPLANT
STRIP CLOSURE SKIN 1/2X4 (GAUZE/BANDAGES/DRESSINGS) IMPLANT
SUCTION FRAZIER TIP 10 FR DISP (SUCTIONS) IMPLANT
SUT BONE WAX W31G (SUTURE) IMPLANT
SUT ETHIBOND 2 OS 4 DA (SUTURE) IMPLANT
SUT ETHILON 4 0 PS 2 18 (SUTURE) ×3 IMPLANT
SUT FIBERWIRE #2 38 T-5 BLUE (SUTURE)
SUT PROLENE 3 0 PS 2 (SUTURE) IMPLANT
SUT TICRON 1 T 12 (SUTURE) IMPLANT
SUT VIC AB 0 CT1 27 (SUTURE)
SUT VIC AB 0 CT1 27XBRD ANBCTR (SUTURE) IMPLANT
SUT VIC AB 1 CT1 27 (SUTURE)
SUT VIC AB 1 CT1 27XBRD ANBCTR (SUTURE) IMPLANT
SUT VIC AB 2-0 PS2 27 (SUTURE) IMPLANT
SUT VIC AB 2-0 SH 27 (SUTURE)
SUT VIC AB 2-0 SH 27XBRD (SUTURE) IMPLANT
SUTURE FIBERWR #2 38 T-5 BLUE (SUTURE) IMPLANT
TOWEL OR 17X24 6PK STRL BLUE (TOWEL DISPOSABLE) ×3 IMPLANT
WAND STAR VAC 90 (SURGICAL WAND) IMPLANT
WATER STERILE IRR 1000ML POUR (IV SOLUTION) ×3 IMPLANT
YANKAUER SUCT BULB TIP NO VENT (SUCTIONS) IMPLANT

## 2014-06-24 NOTE — H&P (View-Only) (Signed)
Kristy [name] is an 43 y.o. female.   Chief Complaint: Left shoulder adhesive capsulitis HPI: : Patient is a 43 year-old female who we had seen back in late January.  In the interim she had done an MRI for continued pain following an injection and p.o. medications.  She has been doing physical therapy as well.  Last note states she is actually regressing with her range of motion.  Increasing pain.    Past Medical History  Diagnosis Date  . Anxiety   . GERD (gastroesophageal reflux disease)   . Wears contact lenses     Past Surgical History  Procedure Laterality Date  . Cesarean section    . Wisdom tooth extraction      Family History  Problem Relation Age of Onset  . Hyperlipidemia Mother   . Diabetes Father    Social History:  reports that she quit smoking about 16 years ago. She does not have any smokeless tobacco history on file. She reports that she drinks alcohol. She reports that she does not use illicit drugs.  Allergies: No Known Allergies   (Not in a hospital admission)  No results found for this or any previous visit (from the past 48 hour(s)). No results found.  Review of Systems  Musculoskeletal: Positive for myalgias, joint pain and neck pain. Negative for falls.  All other systems reviewed and are negative.   Last menstrual period 06/05/2014. Physical Exam  Constitutional: She is oriented to person, place, and time. She appears well-developed and well-nourished. No distress.  HENT:  Head: Normocephalic and atraumatic.  Nose: Nose normal.  Eyes: Conjunctivae and EOM are normal. Pupils are equal, round, and reactive to light.  Neck: Normal range of motion. Neck supple.  Cardiovascular: Normal rate and intact distal pulses.   Respiratory: Effort normal. No respiratory distress. She has no wheezes.  GI: Soft. She exhibits no distension. There is no tenderness.  Musculoskeletal:       Left shoulder: She exhibits decreased range of motion, tenderness, bony  tenderness and pain.  Neurological: She is alert and oriented to person, place, and time. No cranial nerve deficit.  Skin: Skin is warm and dry. No rash noted. No erythema.  Psychiatric: She has a normal mood and affect. Her behavior is normal.     Assessment/Plan Left shoulder adhesive capsulitis  Failing conservative treatments.  Regressing with her motion overall.  Scan was negative for tear, it did show capsular changes consistent with adhesive capsulitis.  We discussed the risks and benefits of outpatient surgery, manipulation, lysis of adhesions, debridement, possible acromioplasty and distal clavicle resection.  Patient wishes to proceed.  This will be scheduled at her convenience.  We will plan on early physical therapy following the surgery.  Given prescriptions today for Percocet and Robaxin.  Patient co-evaluated with Dr. Caffrey today who is in agreement with the treatment plan  Kristy [name] 06/23/2014, 1:34 PM    

## 2014-06-24 NOTE — Progress Notes (Signed)
Assisted Dr. Massagee with left, ultrasound guided, interscalene  block. Side rails up, monitors on throughout procedure. See vital signs in flow sheet. Tolerated Procedure well. 

## 2014-06-24 NOTE — Anesthesia Postprocedure Evaluation (Signed)
  Anesthesia Post-op Note  Patient: Kristy [name]  Procedure(s) Performed: Procedure(s): ARTHROSCOPY LEFT SHOULDER WITH MANIPULATION AND LYSIS OF ADHESIONS (Left) SHOULDER ACROMIOPLASTY (Left)  Patient Location: PACU  Anesthesia Type:GA combined with regional for post-op pain  Level of Consciousness: awake and alert   Airway and Oxygen Therapy: Patient Spontanous Breathing  Post-op Pain: none  Post-op Assessment: Post-op Vital signs reviewed  Post-op Vital Signs: stable  Last Vitals:  Filed Vitals:   06/24/14 1145  BP: 107/67  Pulse: 64  Temp:   Resp: 13    Complications: No apparent anesthesia complications

## 2014-06-24 NOTE — Anesthesia Procedure Notes (Addendum)
Anesthesia Regional Block:  Interscalene brachial plexus block  Pre-Anesthetic Checklist: ,, timeout performed, Correct Patient, Correct Site, Correct Laterality, Correct Procedure, Correct Position, site marked, Risks and benefits discussed,  Surgical consent,  Pre-op evaluation,  At surgeon's request and post-op pain management  Laterality: Upper and Left  Prep: chloraprep and alcohol swabs       Needles:  Injection technique: Single-shot  Needle Type: Echogenic Needle     Needle Length: 4cm 4 cm Needle Gauge: 21 and 21 G  Needle insertion depth: 2 cm   Additional Needles:  Procedures: ultrasound guided (picture in chart) and nerve stimulator Interscalene brachial plexus block  Nerve Stimulator or Paresthesia:  Response: Twitch elicited, 0.5 mA, 0.3 ms,   Additional Responses:   Narrative:  Start time: 06/24/2014 9:00 AM End time: 06/24/2014 9:15 AM Injection made incrementally with aspirations every 5 mL.  Performed by: Personally  Anesthesiologist: MASSAGEE, TERRY  Additional Notes: Block assessed prior to start of surgery   Procedure Name: Intubation Date/Time: 06/24/2014 9:58 AM Performed by: Maryella Shivers Pre-anesthesia Checklist: Patient identified, Emergency Drugs available, Suction available and Patient being monitored Patient Re-evaluated:Patient Re-evaluated prior to inductionOxygen Delivery Method: Circle System Utilized Preoxygenation: Pre-oxygenation with 100% oxygen Intubation Type: IV induction Ventilation: Mask ventilation without difficulty Laryngoscope Size: Mac and 3 Grade View: Grade II Tube type: Oral Tube size: 7.0 mm Number of attempts: 1 Airway Equipment and Method: Stylet,  Oral airway and LTA kit utilized Placement Confirmation: ETT inserted through vocal cords under direct vision,  positive ETCO2 and breath sounds checked- equal and bilateral Secured at: 21 cm Tube secured with: Tape Dental Injury: Teeth and Oropharynx as per  pre-operative assessment

## 2014-06-24 NOTE — Transfer of Care (Signed)
Immediate Anesthesia Transfer of Care Note  Patient: Kristy [name]  Procedure(s) Performed: Procedure(s): ARTHROSCOPY LEFT SHOULDER WITH MANIPULATION AND LYSIS OF ADHESIONS (Left) SHOULDER ACROMIOPLASTY (Left)  Patient Location: PACU  Anesthesia Type:General and Regional  Level of Consciousness: sedated  Airway & Oxygen Therapy: Patient Spontanous Breathing and Patient connected to face mask oxygen  Post-op Assessment: Report given to RN and Post -op Vital signs reviewed and stable  Post vital signs: Reviewed and stable  Last Vitals:  Filed Vitals:   06/24/14 0915  BP: 100/53  Pulse: 68  Temp:   Resp: 14    Complications: No apparent anesthesia complications

## 2014-06-24 NOTE — Brief Op Note (Signed)
06/24/2014  10:50 AM  PATIENT:  Kristy ReilEmily W [name]  44 y.o. female  PRE-OPERATIVE DIAGNOSIS:  adhesive capsulitis  POST-OPERATIVE DIAGNOSIS:  * No post-op diagnosis entered *  PROCEDURE:  Procedure(s): ARTHROSCOPY LEFT SHOULDER WITH MANIPULATION AND LYSIS OF ADHESIONS (Left)  SURGEON:  Surgeon(s) and Role:    * Frederico Hammananiel Caffrey, MD - Primary  PHYSICIAN ASSISTANT: Margart SicklesJoshua Shanti Eichel, PA-C  ASSISTANTS:  ANESTHESIA:   regional and general  EBL:  Total I/O In: 300 [I.V.:300] Out: -   BLOOD ADMINISTERED:none  DRAINS: none   LOCAL MEDICATIONS USED:  NONE  SPECIMEN:  No Specimen  DISPOSITION OF SPECIMEN:  N/A  COUNTS:  YES  TOURNIQUET:  * No tourniquets in log *  DICTATION: .Other Dictation: Dictation Number unknown  PLAN OF CARE: Discharge to home after PACU  PATIENT DISPOSITION:  PACU - hemodynamically stable.   Delay start of Pharmacological VTE agent (>24hrs) due to surgical blood loss or risk of bleeding: not applicable

## 2014-06-24 NOTE — Anesthesia Preprocedure Evaluation (Signed)
Anesthesia Evaluation    Airway        Dental   Pulmonary former smoker,          Cardiovascular     Neuro/Psych    GI/Hepatic   Endo/Other    Renal/GU      Musculoskeletal   Abdominal   Peds  Hematology   Anesthesia Other Findings   Reproductive/Obstetrics                             Anesthesia Physical Anesthesia Plan  ASA: I  Anesthesia Plan: General and Regional   Post-op Pain Management: MAC Combined w/ Regional for Post-op pain   Induction: Intravenous  Airway Management Planned: Oral ETT  Additional Equipment:   Intra-op Plan:   Post-operative Plan:   Informed Consent:   Plan Discussed with:   Anesthesia Plan Comments:         Anesthesia Quick Evaluation

## 2014-06-24 NOTE — Discharge Instructions (Signed)
Diet: As you were doing prior to hospitalization   Activity: Increase activity slowly as tolerated  No lifting or driving for 16XWR   Shower: May shower without a dressing on post op day #2, NO SOAKING in tub   Dressing: You may change your dressing on post op day #2.  Then change the dressing daily with sterile 4"x4"s gauze dressing or band aids.    Weight Bearing: nonweight bearing left arm, remain in sling for first 24hrs or until block wears off, then out of sling range of motion as tolerated and encouraged.  Resume with PT as scheduled tomorrow.  To prevent constipation: you may use a stool softener such as -  Colace ( over the counter) 100 mg by mouth twice a day  Drink plenty of fluids ( prune juice may be helpful) and high fiber foods  Miralax ( over the counter) for constipation as needed.   Precautions: If you experience chest pain or shortness of breath - call 911 immediately For transfer to the hospital emergency department!!  If you develop a fever greater that 101 F, purulent drainage from wound, increased redness or drainage from wound, or calf pain -- Call the office   Follow- Up Appointment: Please call for an appointment to be seen in 1 week  [place] - [phone]    Post Anesthesia Home Care Instructions  Activity: Get plenty of rest for the remainder of the day. A responsible adult should stay with you for 24 hours following the procedure.  For the next 24 hours, DO NOT: -Drive a car -Advertising copywriter -Drink alcoholic beverages -Take any medication unless instructed by your physician -Make any legal decisions or sign important papers.  Meals: Start with liquid foods such as gelatin or soup. Progress to regular foods as tolerated. Avoid greasy, spicy, heavy foods. If nausea and/or vomiting occur, drink only clear liquids until the nausea and/or vomiting subsides. Call your physician if vomiting continues.  Special Instructions/Symptoms: Your throat may  feel dry or sore from the anesthesia or the breathing tube placed in your throat during surgery. If this causes discomfort, gargle with warm salt water. The discomfort should disappear within 24 hours.  If you had a scopolamine patch placed behind your ear for the management of post- operative nausea and/or vomiting:  1. The medication in the patch is effective for 72 hours, after which it should be removed.  Wrap patch in a tissue and discard in the trash. Wash hands thoroughly with soap and water. 2. You may remove the patch earlier than 72 hours if you experience unpleasant side effects which may include dry mouth, dizziness or visual disturbances. 3. Avoid touching the patch. Wash your hands with soap and water after contact with the patch.    Call your surgeon if you experience:   1.  Fever over 101.0. 2.  Inability to urinate. 3.  Nausea and/or vomiting. 4.  Extreme swelling or bruising at the surgical site. 5.  Continued bleeding from the incision. 6.  Increased pain, redness or drainage from the incision. 7.  Problems related to your pain medication. 8. Any change in color, movement and/or sensation 9. Any problems and/or concerns   Regional Anesthesia Blocks  1. Numbness or the inability to move the "blocked" extremity may last from 3-48 hours after placement. The length of time depends on the medication injected and your individual response to the medication. If the numbness is not going away after 48 hours, call your surgeon.  2.  The extremity that is blocked will need to be protected until the numbness is gone and the  Strength has returned. Because you cannot feel it, you will need to take extra care to avoid injury. Because it may be weak, you may have difficulty moving it or using it. You may not know what position it is in without looking at it while the block is in effect.  3. For blocks in the legs and feet, returning to weight bearing and walking needs to be done  carefully. You will need to wait until the numbness is entirely gone and the strength has returned. You should be able to move your leg and foot normally before you try and bear weight or walk. You will need someone to be with you when you first try to ensure you do not fall and possibly risk injury.  4. Bruising and tenderness at the needle site are common side effects and will resolve in a few days.  5. Persistent numbness or new problems with movement should be communicated to the surgeon or the [facility] [place] [phone] [facility] [phone]).

## 2014-06-24 NOTE — Interval H&P Note (Signed)
History and Physical Interval Note:  06/24/2014 8:37 AM  Kristy ReilEmily W [name]  has presented today for surgery, with the diagnosis of adhesive capsulitis  The various methods of treatment have been discussed with the patient and family. After consideration of risks, benefits and other options for treatment, the patient has consented to  Procedure(s): ARTHROSCOPY LEFT SHOULDER WITH MANIPULATION AND LYSIS OF ADHESIONS (Left) as a surgical intervention .  The patient's history has been reviewed, patient examined, no change in status, stable for surgery.  I have reviewed the patient's chart and labs.  Questions were answered to the patient's satisfaction.     Darry Kelnhofer JR,W D

## 2014-06-25 ENCOUNTER — Encounter (HOSPITAL_BASED_OUTPATIENT_CLINIC_OR_DEPARTMENT_OTHER): Payer: Self-pay | Admitting: Orthopedic Surgery

## 2014-06-25 NOTE — Op Note (Signed)
[name]:  Kristy [name], [name]              ACCOUNT NO.:  [id number]  MEDICAL RECORD NO.:  [id number]  LOCATION:                                 FACILITY:  PHYSICIAN:  Dyke BrackettW. D. Shalika Arntz, M.D.    DATE OF BIRTH:  [birth year]  DATE OF PROCEDURE:  06/24/2014 DATE OF DISCHARGE:  06/24/2014                              OPERATIVE REPORT   INDICATIONS:  A 44 year old, intractable left shoulder pain.  Not respond to conservative treatment.  Workup including PT, injection, MRI, and exam consistent with frozen shoulder impingement thought to be amenable to outpatient surgery.  PREOPERATIVE DIAGNOSIS: 1. Frozen shoulder. 2. Degenerative tearing of anterior superior labrum. 3. Impingement.  POSTOPERATIVE DIAGNOSES: 1. Frozen shoulder. 2. Degenerative tearing of anterior superior labrum. 3. Impingement.  OPERATION: 1. Arthroscopic manipulation lysis of adhesions. 2. Arthroscopic debridement torn labrum. 3. Arthroscopic acromioplasty of the left shoulder.  SURGEON:  Dyke BrackettW. D. Ioana Louks, M.D.  ASSISTANT:  Margart SicklesJoshua Chadwell, PA  PROCEDURE:  She was arthroscoped in the beach chair position after general anesthetic nerve block.  She could abduct to 90, post manipulation 180, external rotation to 60, post manipulation at 90, internal rotation 45 postoperatively to 60.  I would describe the adhesions encountered as mild-to-moderate and was manipulated without difficulty.  We then arthroscoped the shoulder with the posterior, lateral, anterior incision, arthroscopic portals.  Systematic inspection of the shoulder showed mild hyperemia of the tissues consistent with frozen shoulder.  There was significant tearing in the anterior and superior labrum, quite extensive debridement of the labrum undersurface of the cuff centrally normal.  The glenohumeral joint surface is normal.  Subacromial space was hypertrophied and extremely inflamed.  Extensive bursectomy was carried out.  Release of the CA ligament.   Acromioplasty was carried out.  AC joint was not clinically or radiographically involved in the process was not violated.  Superior surface of the cuff showed an abrasion type phenomenon consistent with impingement.  It was debrided. No significant cuff tear was noted.  Shoulder drained free of fluid and portals closed with nylon.  Lightly compressive sterile dressing, sling applied.  Taken to recovery in stable condition.     Dyke BrackettW. D. Aarion Kittrell, M.D.     WDC/MEDQ  D:  06/24/2014  T:  06/25/2014  Job:  [id number]

## 2015-03-21 HISTORY — PX: CERVICAL ABLATION: SHX5771

## 2015-11-20 ENCOUNTER — Emergency Department (HOSPITAL_COMMUNITY)
Admission: EM | Admit: 2015-11-20 | Discharge: 2015-11-20 | Disposition: A | Discharge status: Home / Self Care | Payer: 59 | Attending: Emergency Medicine | Admitting: Emergency Medicine

## 2015-11-20 ENCOUNTER — Encounter (HOSPITAL_COMMUNITY): Payer: Self-pay

## 2015-11-20 DIAGNOSIS — Z79899 Other long term (current) drug therapy: Secondary | ICD-10-CM | POA: Diagnosis not present

## 2015-11-20 DIAGNOSIS — Z791 Long term (current) use of non-steroidal anti-inflammatories (NSAID): Secondary | ICD-10-CM | POA: Diagnosis not present

## 2015-11-20 DIAGNOSIS — Z87891 Personal history of nicotine dependence: Secondary | ICD-10-CM | POA: Insufficient documentation

## 2015-11-20 DIAGNOSIS — R42 Dizziness and giddiness: Secondary | ICD-10-CM | POA: Diagnosis not present

## 2015-11-20 LAB — POC URINE PREG, ED: Preg Test, Ur: NEGATIVE

## 2015-11-20 MED ORDER — MECLIZINE HCL 25 MG PO TABS
25.0000 mg | ORAL_TABLET | Freq: Once | ORAL | Status: AC
  Administered 2015-11-20: 25 mg via ORAL
  Filled 2015-11-20: qty 1

## 2015-11-20 MED ORDER — PREDNISONE 20 MG PO TABS: 40.0000 mg | ORAL_TABLET | Freq: Every day | ORAL | 0 refills | Status: DC

## 2015-11-20 MED ORDER — CLONAZEPAM 0.5 MG PO TABS: 0.5000 mg | ORAL_TABLET | Freq: Three times a day (TID) | ORAL | 0 refills | Status: DC | PRN

## 2015-11-20 MED ORDER — METHYLPREDNISOLONE SODIUM SUCC 125 MG IJ SOLR: 125.0000 mg | Freq: Once | INTRAMUSCULAR | Status: DC

## 2015-11-20 MED ORDER — PROCHLORPERAZINE EDISYLATE 5 MG/ML IJ SOLN
10.0000 mg | Freq: Once | INTRAMUSCULAR | Status: AC
  Administered 2015-11-20: 10 mg via INTRAVENOUS
  Filled 2015-11-20: qty 2

## 2015-11-20 MED ORDER — SODIUM CHLORIDE 0.9 % IV BOLUS (SEPSIS)
1000.0000 mL | Freq: Once | INTRAVENOUS | Status: AC
  Administered 2015-11-20: 1000 mL via INTRAVENOUS

## 2015-11-20 MED ORDER — CLONAZEPAM 0.5 MG PO TABS
0.5000 mg | ORAL_TABLET | Freq: Once | ORAL | Status: AC
  Administered 2015-11-20: 0.5 mg via ORAL
  Filled 2015-11-20: qty 1

## 2015-11-20 MED ORDER — DEXAMETHASONE SODIUM PHOSPHATE 10 MG/ML IJ SOLN
10.0000 mg | Freq: Once | INTRAMUSCULAR | Status: AC
  Administered 2015-11-20: 10 mg via INTRAVENOUS
  Filled 2015-11-20: qty 1

## 2015-11-20 MED ORDER — DIPHENHYDRAMINE HCL 50 MG/ML IJ SOLN
25.0000 mg | Freq: Once | INTRAMUSCULAR | Status: AC
  Administered 2015-11-20: 25 mg via INTRAVENOUS
  Filled 2015-11-20: qty 1

## 2015-11-20 NOTE — ED Notes (Signed)
Bed: WA22 Expected date:  Expected time:  Means of arrival:  Comments: 

## 2015-11-20 NOTE — ED Notes (Signed)
I have ambulated pt from her exam rm 24 to rm 18 which she did remarking great improvement from her presenting symptom of dizziness. She states "she is able to walk without assistance something she was unable to do prior to arrival." I have verbally notified EDP.

## 2015-11-20 NOTE — ED Provider Notes (Addendum)
[facility]-EMERGENCY DEPT Provider Note   CSN: [id number] Arrival date & time: 11/20/15  0920     History   Chief Complaint No chief complaint on file.   HPI Kristy [name] is a 45 y.o. female.  45yo F w/ PMH of GERD and anxiety who p/w dizziness. A few days ago, the patient had a sudden onset of dizziness which she describes as a sensation of movement which began without any sudden head movements. She was initially having brief, intermittent episodes and saw her PCP. She had blood work and urine which were normal and was given meclizine. She took the medication twice yesterday and slept most of the day, she is unsure whether the medication helped. This morning, she woke up with ongoing dizziness that has been constant and associated with nausea as well as one episode of vomiting. The dizziness is worse with any movement or sitting up. She has had a mild headache that was gradual in onset since the vomiting. No associated visual changes, extremity numbness/weakness, speech difficulty, or confusion. No recent head injury. She denies any fevers, cough/cold symptoms, ear pain, tinnitus, or recent illness. She has never had this problem before. No family history of early stroke or neuromuscular disorders.    Past Medical History:  Diagnosis Date  . Anxiety   . GERD (gastroesophageal reflux disease)   . Wears contact lenses     Patient Active Problem List   Diagnosis Date Noted  . Depression with anxiety 05/08/2011  . GERD 10/09/2006  . SYMPTOM, EDEMA 10/09/2006    Past Surgical History:  Procedure Laterality Date  . CESAREAN SECTION    . SHOULDER ACROMIOPLASTY Left 06/24/2014   Procedure: SHOULDER ACROMIOPLASTY;  Surgeon: Frederico Hamman, MD;  Location: [facility];  Service: Orthopedics;  Laterality: Left;  . SHOULDER ARTHROSCOPY Left 06/24/2014   Procedure: ARTHROSCOPY LEFT SHOULDER WITH MANIPULATION AND LYSIS OF ADHESIONS;  Surgeon: Frederico Hamman, MD;  Location: [facility];  Service: Orthopedics;  Laterality: Left;  . WISDOM TOOTH EXTRACTION      OB History    No data available       Home Medications    Prior to Admission medications   Medication Sig Start Date End Date Taking? Authorizing Provider  ADDERALL XR 20 MG 24 hr capsule Take 20 mg by mouth daily. 11/14/15  Yes Historical Provider, MD  DULoxetine (CYMBALTA) 60 MG capsule Take 60 mg by mouth daily. 10/24/15  Yes Historical Provider, MD  ibuprofen (ADVIL,MOTRIN) 200 MG tablet Take 400-600 mg by mouth every 6 (six) hours as needed for headache, mild pain or moderate pain.   Yes Historical Provider, MD  pantoprazole (PROTONIX) 40 MG tablet Take 1 tablet (40 mg total) by mouth daily. 05/08/11  Yes Pattricia Boss, PA-C    Family History Family History  Problem Relation Age of Onset  . Hyperlipidemia Mother   . Diabetes Father     Social History Social History  Substance Use Topics  . Smoking status: Former Smoker    Quit date: 06/19/1998  . Smokeless tobacco: Not on file     Comment: quit 11  years ago  . Alcohol use Yes     Comment: occ     Allergies   Review of patient's allergies indicates no known allergies.   Review of Systems Review of Systems 10 Systems reviewed and are negative for acute change except as noted in the HPI.   Physical Exam Updated Vital Signs BP 107/69 (BP  Location: Left Arm)   Pulse 74   Temp 97.8 F (36.6 C) (Oral)   Resp 16   LMP 11/19/2015 (Exact Date)   SpO2 100%   Physical Exam  Constitutional: She is oriented to person, place, and time. She appears well-developed and well-nourished. No distress.  Awake, alert, uncomfortable  HENT:  Head: Normocephalic and atraumatic.  Right Ear: Tympanic membrane and ear canal normal.  Left Ear: Tympanic membrane and ear canal normal.  Eyes: Conjunctivae and EOM are normal. Pupils are equal, round, and reactive to light.  Neck: Neck supple.  Cardiovascular: Normal rate, regular rhythm and  normal heart sounds.   No murmur heard. Pulmonary/Chest: Effort normal and breath sounds normal. No respiratory distress.  Abdominal: Soft. Bowel sounds are normal. She exhibits no distension. There is no tenderness.  Musculoskeletal: She exhibits no edema.  Neurological: She is alert and oriented to person, place, and time. She has normal reflexes. No cranial nerve deficit. She exhibits normal muscle tone.  Fluent speech, normal finger-to-nose testing, negative pronator drift, no clonus Horizontal nystagmus during EOM 5/5 strength and normal sensation x all 4 extremities  Skin: Skin is warm and dry.  Psychiatric: She has a normal mood and affect. Judgment and thought content normal.  Nursing note and vitals reviewed.    ED Treatments / Results  Labs (all labs ordered are listed, but only abnormal results are displayed) Labs Reviewed  POC URINE PREG, ED    EKG  EKG Interpretation None       Radiology No results found.  Procedures Procedures (including critical care time)  Medications Ordered in ED Medications  sodium chloride 0.9 % bolus 1,000 mL (0 mLs Intravenous Stopped 11/20/15 1542)  diphenhydrAMINE (BENADRYL) injection 25 mg (25 mg Intravenous Given 11/20/15 1127)  prochlorperazine (COMPAZINE) injection 10 mg (10 mg Intravenous Given 11/20/15 1128)  meclizine (ANTIVERT) tablet 25 mg (25 mg Oral Given 11/20/15 1356)  dexamethasone (DECADRON) injection 10 mg (10 mg Intravenous Given 11/20/15 1543)     Initial Impression / Assessment and Plan / ED Course  I have reviewed the triage vital signs and the nursing notes.  Pertinent labs that were available during my care of the patient were reviewed by me and considered in my medical decision making (see chart for details).  Clinical Course   PT w/ several Days of vertigo symptoms that were initially intermittent but have been constant since this morning. She was uncomfortable but awake and alert, in no acute distress. Normal  vital signs. Normal neurologic exam but I did note horizontal nystagmus during extraocular movement testing. Because she had recent lab work which was unremarkable and has had no infectious symptoms, I do not feel she needs lab work currently. Gave the patient a fluid bolus as well as Benadryl and Compazine to treat both headache and vertigo with nausea. Later gave meclizine b/c of ongoing sx.  The patient has no risk factors for concerning physical exam findings to suggest central cause of vertigo. I discussed case with neurologist, Dr. Roxy Manns, who agreed that symptoms sounded peripheral and agreed that pt did not require imaging at this time. He has agreed w/ treatment w/ steroids for possible vestibular neuritis. Gave decadron IV. On re-examination, pt stated that she had had an improvement in her symptoms. She was able tolerate liquids. She attempted to ambulate and stated that upon standing she became very dizzy again. I have given her a dose of clonazepam per neurology recommendations. I'm signing the patient out to  the oncoming provider. Her disposition is pending clinical improvement. If symptoms improve, I anticipate discharge with steroid taper and PCP follow-up.  Final Clinical Impressions(s) / ED Diagnoses   Final diagnoses:  None    New Prescriptions New Prescriptions   No medications on file     Laurence Spatesachel Morgan Maddock Finigan, MD 11/20/15 1620    Laurence Spatesachel Morgan Hazem Kenner, MD 11/20/15 1705

## 2015-11-20 NOTE — ED Provider Notes (Signed)
Patient feels better after the oral Klonopin. She was able to get up and amplitude to the bathroom without difficulty. Still feels a little dizzy but is better. Requesting neurology follow-up and getting is reasonable. Discharge home with return precautions   Pricilla LovelessScott Marilena Trevathan, MD 11/20/15 [phone]

## 2015-11-20 NOTE — ED Triage Notes (Signed)
She c/o worsening vertigo (actual "spinning" feeling x few days.  Seen by her pcp a couple of days ago, who performed blood work and urine, all of which was "normal"; and she was prescribed Antivert. Antivert notwithstanding, she states this is getting worse.  She c/o nausea, but no vomiting.

## 2015-11-24 ENCOUNTER — Ambulatory Visit (INDEPENDENT_AMBULATORY_CARE_PROVIDER_SITE_OTHER): Payer: 59 | Admitting: Neurology

## 2015-11-24 ENCOUNTER — Encounter: Payer: Self-pay | Admitting: Neurology

## 2015-11-24 VITALS — BP 110/74 | HR 82 | Ht 65.0 in | Wt 145.0 lb

## 2015-11-24 DIAGNOSIS — H8120 Vestibular neuronitis, unspecified ear: Secondary | ICD-10-CM | POA: Diagnosis not present

## 2015-11-24 NOTE — Progress Notes (Signed)
NEUROLOGY CONSULTATION NOTE  Kristy [name] MRN: [id number] DOB: Apr 10, 1970  Referring provider: ED referral Primary care provider: Cain Saupe, MD  Reason for consult:  vertigo  HISTORY OF PRESENT ILLNESS: Kristy [name] is a 45 year old right-handed woman with GERD and anxiety who presents for vertigo.  History obtained by patient, her husband (who accompanies her), PCP note and ED note.  About 2 weeks ago, she began experiencing brief episodes of spinning sensation and feeling off-balance, lasting only a few seconds.  Over the course of days, it became more frequent, until it was a near constant sensation of spinning.  It was present when completely still but aggravated by movement.  There was nausea.  There was no associated diplopia, facial numbness, facial weakness, unilateral weakness or numbness, ringing in the ears, or perceived hearing loss.  She denies preceding viral illness or papular rash.  She presented to the ED on 11/20/15 for further evaluation.  She was diagnosed with possible vestibular neuritis and was given IV Decadron, Benadryl, Compazine and then discharged on meclizine, clonazepam and steroid taper.  Meclizine was ineffective.  Clonazepam helps somewhat but makes her sleepy.  She is currently on day 4 of the steroid taper (with total of 9 days).  Intensity and spinning has decreased significantly but she still feels slightly off with sensation of movement.  She denies prior history of vertigo.  She and her husband plan on going on a cruise in November.  PAST MEDICAL HISTORY: Past Medical History:  Diagnosis Date  . Anxiety   . GERD (gastroesophageal reflux disease)   . Wears contact lenses     PAST SURGICAL HISTORY: Past Surgical History:  Procedure Laterality Date  . CESAREAN SECTION    . SHOULDER ACROMIOPLASTY Left 06/24/2014   Procedure: SHOULDER ACROMIOPLASTY;  Surgeon: Frederico Hamman, MD;  Location: [facility];  Service: Orthopedics;   Laterality: Left;  . SHOULDER ARTHROSCOPY Left 06/24/2014   Procedure: ARTHROSCOPY LEFT SHOULDER WITH MANIPULATION AND LYSIS OF ADHESIONS;  Surgeon: Frederico Hamman, MD;  Location: [facility];  Service: Orthopedics;  Laterality: Left;  . WISDOM TOOTH EXTRACTION      MEDICATIONS: Current Outpatient Prescriptions on File Prior to Visit  Medication Sig Dispense Refill  . clonazePAM (KLONOPIN) 0.5 MG tablet Take 1 tablet (0.5 mg total) by mouth 3 (three) times daily as needed (vertigo symptoms). 8 tablet 0  . DULoxetine (CYMBALTA) 60 MG capsule Take 60 mg by mouth daily.    Marland Kitchen ibuprofen (ADVIL,MOTRIN) 200 MG tablet Take 400-600 mg by mouth every 6 (six) hours as needed for headache, mild pain or moderate pain.    . pantoprazole (PROTONIX) 40 MG tablet Take 1 tablet (40 mg total) by mouth daily. 90 tablet 1  . predniSONE (DELTASONE) 20 MG tablet Take 2 tablets (40 mg total) by mouth daily. Take 40 mg by mouth daily for 3 days, then 20mg  by mouth daily for 3 days, then 10mg  daily for 3 days 12 tablet 0  . ADDERALL XR 20 MG 24 hr capsule Take 20 mg by mouth daily.     No current facility-administered medications on file prior to visit.     ALLERGIES: No Known Allergies  FAMILY HISTORY: Family History  Problem Relation Age of Onset  . Hyperlipidemia Mother   . Diabetes Father     SOCIAL HISTORY: Social History   Social History  . Marital status: Married    Spouse name: N/A  . Number of children: N/A  .  Years of education: N/A   Occupational History  . Not on file.   Social History Main Topics  . Smoking status: Former Smoker    Quit date: 06/19/1998  . Smokeless tobacco: Not on file     Comment: quit 11  years ago  . Alcohol use Yes     Comment: occ  . Drug use: No  . Sexual activity: Not on file   Other Topics Concern  . Not on file   Social History Narrative  . No narrative on file    REVIEW OF SYSTEMS: Constitutional: No fevers, chills, or sweats, no  generalized fatigue, change in appetite Eyes: No visual changes, double vision, eye pain Ear, nose and throat: No hearing loss, ear pain, nasal congestion, sore throat Cardiovascular: No chest pain, palpitations Respiratory:  No shortness of breath at rest or with exertion, wheezes GastrointestinaI: No nausea, vomiting, diarrhea, abdominal pain, fecal incontinence Genitourinary:  No dysuria, urinary retention or frequency Musculoskeletal:  No neck pain, back pain Integumentary: No rash, pruritus, skin lesions Neurological: as above Psychiatric: No depression, insomnia, anxiety Endocrine: No palpitations, fatigue, diaphoresis, mood swings, change in appetite, change in weight, increased thirst Hematologic/Lymphatic:  No purpura, petechiae. Allergic/Immunologic: no itchy/runny eyes, nasal congestion, recent allergic reactions, rashes  PHYSICAL EXAM: Vitals:   11/24/15 0757 11/24/15 0758  BP: 116/62 110/74  Pulse: 81 82   General: No acute distress.  Patient appears well-groomed.  Head:  Normocephalic/atraumatic Eyes:  fundi examined but not visualized Neck: supple, no paraspinal tenderness, full range of motion Back: No paraspinal tenderness Heart: regular rate and rhythm Lungs: Clear to auscultation bilaterally. Vascular: No carotid bruits. Neurological Exam: Mental status: alert and oriented to person, place, and time, recent and remote memory intact, fund of knowledge intact, attention and concentration intact, speech fluent and not dysarthric, language intact. Cranial nerves: CN I: not tested CN II: pupils equal, round and reactive to light, visual fields intact CN III, IV, VI:  full range of motion, no nystagmus, no ptosis CN V: facial sensation intact CN VII: upper and lower face symmetric CN VIII: hearing intact CN IX, X: gag intact, uvula midline CN XI: sternocleidomastoid and trapezius muscles intact CN XII: tongue midline Bulk & Tone: normal, no  fasciculations. Motor:  5/5 throughout  Sensation: temperature  and vibration sensation intact. Deep Tendon Reflexes:  2+ throughout, toes downgoing.  Finger to nose testing:  Without dysmetria.  Heel to shin:  Without dysmetria.  Gait:  Mildly cautious but normal station and stride.  Able to turn and tandem walk. Romberg negative. 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  She has no lateralizing symptoms or other cranial nerve disorders to suspect an intracranial etiology.  Due to her age and lack of risk factors, I highly doubt stroke.  She does not exhibit symptoms such as tinnitus or hearing loss to suggest Meniere's.  Dix-Hallpike and Head Impulse Test were negative, however this may be suppressed due to the steroids.  She is already noting improvement while on the steroid taper.  PLAN: 1.  She will continue steroid taper.  If symptoms are not significantly improved and are ongoing, we will refer her to vestibular rehab 2.  She may continue clonazepam as needed, but I urged her to limit use as much as possible. 3.  She will follow up in about 3 months (or sooner if needed).  If symptoms persist despite therapy, consider MRI of brain and IAC  with and without contrast. 4.  I advised not to go on the cruise if possible, as I really wouldn't want her exposed to anything that may aggravate symptoms this soon.  Thank you for allowing me to take part in the care of this patient.  45 minutes spent face to face with patient, over 50% spent counseling.   Shon Millet, DO  CC:  Cain Saupe, MD

## 2015-11-24 NOTE — Patient Instructions (Addendum)
I think it is a viral inflammation in the nerve in your ear.  I don't suspect it is anything in your brain 1.  Continue the steroid taper.  If you feel the dizziness has not significantly improved, contact me and we can send you for vestibular rehabilitation. 2.  Continue the clonazepam as needed.  Try not to take it unless you really need to. 3. I would recommend NOT going on cruise if at all possible as it may agitate symptoms.  4.  Follow up with me in December.  If therapy is not effective, we will pursue other testing.

## 2015-11-29 ENCOUNTER — Telehealth: Payer: Self-pay

## 2015-11-29 ENCOUNTER — Telehealth: Payer: Self-pay | Admitting: Neurology

## 2015-11-29 DIAGNOSIS — R42 Dizziness and giddiness: Secondary | ICD-10-CM

## 2015-11-29 NOTE — Telephone Encounter (Signed)
Spoke with patient. She is aware of recommendations.

## 2015-11-29 NOTE — Telephone Encounter (Signed)
Pt called with update. Stated that she was told to call with changing symptoms. Pt stated that she thought she was on the mend, and was feeling pretty great on Saturday. However, symptoms began to return on Sunday. Pt stated she has been in bed since late Sunday evening. Pt complaining about the room spinning and some nausea. Pt last dose of steroid is today. Pt is going to call PCP for refill on clonazepam. Please advise.

## 2015-11-29 NOTE — Telephone Encounter (Signed)
I would like to go ahead and order MRI of brain and internal auditory canals with and without contrast to make sure there is no other cause for the persistent vertigo.

## 2015-11-29 NOTE — Telephone Encounter (Signed)
Left message on machine for pt to return call to the office.  

## 2015-11-29 NOTE — Telephone Encounter (Signed)
Kristy [name] [birth year]. Her # is [phone].  She has an MRI scheduled for 9/20 and she said her Vertigo is so bad she does not think she can wait that long. She was wondering if there was somewhere else that could do it sooner. Thank you

## 2015-11-30 ENCOUNTER — Ambulatory Visit (HOSPITAL_COMMUNITY)
Admission: RE | Admit: 2015-11-30 | Discharge: 2015-11-30 | Disposition: A | Discharge status: Home / Self Care | Payer: 59 | Source: Ambulatory Visit | Attending: Neurology | Admitting: Neurology

## 2015-11-30 ENCOUNTER — Other Ambulatory Visit: Payer: Self-pay | Admitting: Neurology

## 2015-11-30 DIAGNOSIS — R42 Dizziness and giddiness: Secondary | ICD-10-CM | POA: Insufficient documentation

## 2015-11-30 MED ORDER — GADOBENATE DIMEGLUMINE 529 MG/ML IV SOLN: 15.0000 mL | Freq: Once | INTRAVENOUS | Status: DC | PRN

## 2015-11-30 MED ORDER — GADOBENATE DIMEGLUMINE 529 MG/ML IV SOLN
15.0000 mL | Freq: Once | INTRAVENOUS | Status: AC | PRN
  Administered 2015-11-30: 12 mL via INTRAVENOUS

## 2015-11-30 NOTE — Telephone Encounter (Signed)
Called [facility] and they can get pt in sooner if imaging order is placed as stat. Okay to wait? Please advise.

## 2015-11-30 NOTE — Telephone Encounter (Signed)
Spoke to patient. Okay to go today. Pt was scheduled for 6:45 p.m. @ [place]. Pt aware.

## 2015-11-30 NOTE — Telephone Encounter (Signed)
Attempted to reach pt to find out if she would be available to go today if order switched to stat. Pt did not answer. VM left asking her to return call.

## 2015-11-30 NOTE — Telephone Encounter (Signed)
If we can get it sooner, then lets do that.

## 2015-12-01 ENCOUNTER — Telehealth: Payer: Self-pay

## 2015-12-01 DIAGNOSIS — R42 Dizziness and giddiness: Secondary | ICD-10-CM

## 2015-12-01 NOTE — Telephone Encounter (Signed)
Message relayed to patient. Verbalized understanding and denied questions. Referral placed to [facility] for Vestibular rehab.

## 2015-12-01 NOTE — Telephone Encounter (Signed)
-----   Message from Drema DallasAdam R Jaffe, DO sent at 12/01/2015  7:07 AM EDT ----- MRI is normal, which is reassuring.  If the clonazepam is not helpful, then I recommend vestibular rehabilitation.

## 2015-12-03 ENCOUNTER — Ambulatory Visit: Payer: 59 | Attending: Neurology

## 2015-12-03 DIAGNOSIS — R42 Dizziness and giddiness: Secondary | ICD-10-CM | POA: Insufficient documentation

## 2015-12-03 DIAGNOSIS — R2689 Other abnormalities of gait and mobility: Secondary | ICD-10-CM | POA: Diagnosis present

## 2015-12-03 NOTE — Therapy (Signed)
[facility] [address] [place], [place], [address] Phone: [phone]   Fax:  [phone]  Physical Therapy Evaluation  Patient Details  Name: Kristy [name] MRN: [id number] Date of Birth: 02-24-1971 Referring Provider: Dr. Everlena CooperJaffe  Encounter Date: 12/03/2015      PT End of Session - 12/03/15 0841    Visit Number 1   Number of Visits 9   Date for PT Re-Evaluation 01/02/16   Authorization Type UHC-60 visit combined limit   PT Start Time 0803   PT Stop Time 0838   PT Time Calculation (min) 35 min   Equipment Utilized During Treatment --  min guard to S prn   Activity Tolerance Patient tolerated treatment well   Behavior During Therapy [facility] for tasks assessed/performed      Past Medical History:  Diagnosis Date  . Anxiety   . GERD (gastroesophageal reflux disease)   . Wears contact lenses     Past Surgical History:  Procedure Laterality Date  . CESAREAN SECTION    . SHOULDER ACROMIOPLASTY Left 06/24/2014   Procedure: SHOULDER ACROMIOPLASTY;  Surgeon: Frederico Hammananiel Caffrey, MD;  Location: [facility];  Service: Orthopedics;  Laterality: Left;  . SHOULDER ARTHROSCOPY Left 06/24/2014   Procedure: ARTHROSCOPY LEFT SHOULDER WITH MANIPULATION AND LYSIS OF ADHESIONS;  Surgeon: Frederico Hammananiel Caffrey, MD;  Location: [facility];  Service: Orthopedics;  Laterality: Left;  . WISDOM TOOTH EXTRACTION      There were no vitals filed for this visit.       Subjective Assessment - 12/03/15 0807    Subjective Pt paints houses for a living and reported dizziness began about 4 weeks at work or after work. Pt reported she thought she was dehydrated, and felt herself swaying 2/2 dizziness. Dizziness was intermittent and then it became constant about 2 weeks ago. Pt reported MD prescribed meclizine, but it did not help. Pt went to ED during a dizzy spell, and was placed on prednisone and klonopin. Pt reports dizziness  has been getting a little better, but it comes and goes. Pt denied cold/illness or fall prior to onset. Pt denied hearing loss, N/T, weakness, or tinnitus.  Pt describes dizziness as a spinning sensation. Pt reports intermittent mild HAs during dizziness.  At end of session pt reported she had a spinal injection (L4-5?) in 08/2015 to reduce hip pain.   Pertinent History Anxiety, ADD   Patient Stated Goals To be able to function without dizziness.    Currently in Pain? No/denies            [facility] - 12/03/15 0813      Assessment   Medical Diagnosis Severe vertigo   Referring Provider Dr. Everlena CooperJaffe   Onset Date/Surgical Date 11/02/15   Hand Dominance Right   Prior Therapy none     Precautions   Precautions None     Restrictions   Weight Bearing Restrictions No     Balance Screen   Has the patient fallen in the past 6 months No   Has the patient had a decrease in activity level because of a fear of falling?  No   Is the patient reluctant to leave their home because of a fear of falling?  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     Sensation   Additional Comments Pt denied N/T     Ambulation/Gait   Ambulation/Gait Yes   Ambulation/Gait Assistance 7: Independent   Ambulation Distance (Feet) 100 Feet   Assistive device None   Gait Pattern Step-through pattern;Decreased stride length;Decreased trunk rotation  guarded gait pattern   Ambulation Surface Level;Indoor   Gait velocity 3.54ft/sec.  no AD     Functional Gait  Assessment   Gait assessed  Yes   Gait Level Surface Walks 20 ft in less than 7 sec but greater than 5.5 sec, uses assistive device, slower speed, mild gait deviations, or deviates 6-10 in outside of the 12 in walkway width.   Change in Gait Speed Able to change speed, demonstrates mild gait deviations, deviates 6-10 in outside of the 12 in walkway width, or no gait deviations, unable to achieve a major change in velocity, or uses a change in velocity, or uses an assistive device.   Gait with Horizontal Head Turns Performs head turns smoothly with slight change in gait velocity (eg, minor disruption to smooth gait path), deviates 6-10 in outside 12 in walkway width, or uses an assistive device.   Gait with Vertical Head Turns Performs task with slight change in gait velocity (eg, minor disruption to smooth gait path), deviates 6 - 10 in outside 12 in walkway width or uses assistive device   Gait and Pivot Turn Pivot turns safely within 3 sec and stops quickly with no loss of balance.   Step Over Obstacle Is able to step over 2 stacked shoe boxes taped together (9 in total height) without changing gait speed. No evidence of imbalance.   Gait with Narrow Base of Support Ambulates 4-7 steps.   Gait with Eyes Closed Cannot walk 20 ft without assistance, severe gait deviations or imbalance, deviates greater than 15 in outside 12 in walkway width or will not attempt task.   Ambulating Backwards Walks 20 ft, slow speed, abnormal gait pattern, evidence for imbalance, deviates 10-15 in outside 12 in  walkway width.   Steps Alternating feet, no rail.   Total Score 19            Vestibular Assessment - 12/03/15 0819      Symptom Behavior   Type of Dizziness Spinning   Frequency of Dizziness 3 days of dizziness and 3 days of no dizziness, was constant   Duration of Dizziness Started intermittent, then constant, now intermittent   Aggravating Factors No known aggravating factors   Relieving Factors Lying supine     Occulomotor Exam   Occulomotor Alignment Normal   Spontaneous Absent   Gaze-induced Absent   Head shaking Horizontal Absent   Head Shaking Vertical Absent   Smooth Pursuits Intact   Saccades Intact  Comment No reports of dizziness     Vestibulo-Occular Reflex   VOR 1 Head Only (x 1 viewing) WFL, no dizziness     Positional Testing   Dix-Hallpike Dix-Hallpike Right;Dix-Hallpike Left   Horizontal Canal Testing Horizontal Canal Right;Horizontal Canal Left     Dix-Hallpike Right   Dix-Hallpike Right Duration none   Dix-Hallpike Right Symptoms No nystagmus     Dix-Hallpike Left   Dix-Hallpike Left Duration none   Dix-Hallpike Left Symptoms No nystagmus     Horizontal Canal Right   Horizontal Canal Right Duration Slight dizziness but not spinning   Horizontal Canal Right Symptoms Normal     Horizontal Canal Left   Horizontal Canal Left Duration none   Horizontal Canal Left Symptoms Normal                       PT Education - 12/03/15 0840    Education provided Yes   Education Details PT educated pt on frequency/duration and outcome measure results. PT also educated pt on exam findings.   Person(s) Educated Patient   Methods Explanation   Comprehension Verbalized understanding          PT Short Term Goals - 12/03/15 1328      PT SHORT TERM GOAL #1   Title same as STGs.            PT Long Term Goals - 12/03/15 1328      PT LONG TERM GOAL #1   Title Pt will be IND in HEP in order to reduce dizziness and improve balance.  TARGET DATE FOR ALL LTGS: 12/31/15   Status New     PT LONG TERM GOAL #2   Title Perform SOT and write goal if appropriate.    Status New     PT LONG TERM GOAL #3   Title Pt will improve FGA score to >/=27/30 to decr. falls risk.   Status New     PT LONG TERM GOAL #4   Title Pt will amb. 1000', over even/uneven terrain, while performing head turns with dizziness not incr. >2 points to improve functional mobility.   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 in dizziness in order to perform work duties.    Status New               Plan - 12/03/15 0841    Clinical Impression Statement Pt is a pleasant 45y/o female presenting to OPPT neuro with vertigo. 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 Pt's PMH significant for the following: ADD with anxiety and injection in L4-5 (pt not sure type of injection and exact location) in 08/2015 to reduce hip pain. Pt's exam negative for positional vertigo and VOR/occulomotor exam WNL. Pt noted to experience the following deficits during exam: gait deviations during dynamic gait activities, dizziness, and impaired balance. Pt's FGA score indicates pt is at medium risk for falls. Pt's DHI score indicates pt feels dizziness has moderate impact on functional abilities.    Rehab Potential Good   Clinical Impairments Affecting Rehab Potential co-morbidities   PT Frequency 2x / week   PT Duration 4 weeks   PT Treatment/Interventions ADLs/Self Care Home Management;Biofeedback;Canalith Repostioning;Neuromuscular re-education;Cognitive remediation;Balance training;Therapeutic exercise;Therapeutic activities;Manual techniques;Functional mobility training;Stair training;Gait training;DME Instruction;Patient/family education;Vestibular   PT Next Visit Plan SOT and write goal prn, initiate vestibular balance HEP.  Consulted and Agree with Plan of Care Patient       Patient will benefit from skilled therapeutic intervention in order to improve the following deficits and impairments:  Abnormal gait, Decreased activity tolerance, Decreased mobility, Decreased balance, Dizziness  Visit Diagnosis: Dizziness and giddiness - Plan: PT plan of care cert/re-cert  Other abnormalities of gait and mobility - Plan: PT plan of care cert/re-cert     Problem List Patient Active Problem List   Diagnosis Date Noted  . Depression with anxiety 05/08/2011  . GERD 10/09/2006  . SYMPTOM, EDEMA 10/09/2006    Brielyn Bosak L 12/03/2015, 1:58 PM  [facility] [address] [place], [place], [address] Phone: [phone]   Fax:  [phone]  Name: Kristy [name] MRN: [id number] Date of Birth: [birth year]  Zerita Boers, PT,DPT 12/03/15 1:59 PM Phone: [phone] Fax: [phone]

## 2015-12-08 ENCOUNTER — Other Ambulatory Visit: Payer: 59

## 2015-12-10 ENCOUNTER — Ambulatory Visit: Payer: 59

## 2015-12-10 DIAGNOSIS — R42 Dizziness and giddiness: Secondary | ICD-10-CM

## 2015-12-10 DIAGNOSIS — R2689 Other abnormalities of gait and mobility: Secondary | ICD-10-CM

## 2015-12-10 NOTE — Patient Instructions (Addendum)
Perform in a corner with chair in front of you OR at kitchen sink with chair behind you:  Feet Together (Compliant Surface) Head Motion - Eyes Closed    Stand on compliant surface: _pillow/cushion_______ with feet together. Close eyes and move head slowly, up and down 5 times and side to side 5 times. Repeat __3__ times per session. Do __1__ sessions per day.  Copyright  VHI. All rights reserved.  Feet Together (Compliant Surface) Varied Arm Positions - Eyes Closed    Stand on compliant surface: ___pillow/cushion_____ with feet together and arms at your side. Close eyes and visualize upright position. Hold__30__ seconds. Repeat __3__ times per session. Do __1__ sessions per day.  Copyright  VHI. All rights reserved.  Side to Side Head Motion    Perform without assistive device. Walking on solid surface, turn head and eyes to left for __2__ steps. Then, turn head and eyes to opposite side for __2__ steps. Repeat sequence __4__ times per session. Do _1___ sessions per day.  Copyright  VHI. All rights reserved.  Up / Down Head Motion    Perform without assistive device. Walking on solid surface, move head and eyes toward ceiling for __2__ steps.  Then, move head and eyes toward floor for __2__ steps. Repeat __4__ times per session. Do __1__ sessions per day.  Copyright  VHI. All rights reserved.  Walking    Perform at [employer]. Walk on solid surface with hand close to support with eyes closed. Attempt to keep hand away from support for longer periods of time, keeping a straight path. Repeat __4__ times per session. Do __1__ sessions per day.   Copyright  VHI. All rights reserved.

## 2015-12-10 NOTE — Therapy (Signed)
[facility] [address] [place], [place], [address] Phone: [phone]   Fax:  [phone]  Physical Therapy Treatment  Patient Details  Name: Kristy [name] MRN: [id number] Date of Birth: 05/28/1970 Referring Provider: Dr. Everlena CooperJaffe  Encounter Date: 12/10/2015      PT End of Session - 12/10/15 1350    Visit Number 2   Number of Visits 9   Date for PT Re-Evaluation 01/02/16   Authorization Type UHC-60 visit combined limit   PT Start Time 0850  pt arrived late   PT Stop Time 0931   PT Time Calculation (min) 41 min   Equipment Utilized During Treatment --  SOT harness and min guard to S for safety.   Activity Tolerance Patient tolerated treatment well   Behavior During Therapy [facility] for tasks assessed/performed      Past Medical History:  Diagnosis Date  . Anxiety   . GERD (gastroesophageal reflux disease)   . Wears contact lenses     Past Surgical History:  Procedure Laterality Date  . CESAREAN SECTION    . SHOULDER ACROMIOPLASTY Left 06/24/2014   Procedure: SHOULDER ACROMIOPLASTY;  Surgeon: Frederico Hammananiel Caffrey, MD;  Location: [facility];  Service: Orthopedics;  Laterality: Left;  . SHOULDER ARTHROSCOPY Left 06/24/2014   Procedure: ARTHROSCOPY LEFT SHOULDER WITH MANIPULATION AND LYSIS OF ADHESIONS;  Surgeon: Frederico Hammananiel Caffrey, MD;  Location: [facility];  Service: Orthopedics;  Laterality: Left;  . WISDOM TOOTH EXTRACTION      There were no vitals filed for this visit.      Subjective Assessment - 12/10/15 0852    Subjective (P)  Pt reports dizziness has been a little better this week. 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 Pt denied falls.    Pertinent History (P)  Anxiety, ADD   Patient Stated Goals (P)  To be able to function without dizziness.    Currently in Pain? (P)  No/denies         Neuro re-ed:  sensory organization test performed with following results: Conditions: 1:  WNL 2:  WNL 3:  WNL 4: 2 below normal limits and 1 above normal. 5: WNL 6: WNL Composite score:  79 Sensory Analysis Som: WNL Vis: WNL Vest: WNL Pref: WNL Strategy analysis:   Good use of ankle/hip strategies.  COG alignment:     Slightly posterior bias.                     [facility] Treatment/Exercise - 12/10/15 1352      High Level Balance   High Level Balance Activities Head turns;Other (comment)  amb. with EC   High Level Balance Comments Performed at counter 4x7' with intermittent UE support and constant S for safety. Cues for technique. Pt reported head nods made her feel more unsteady.  Please see HEP for details.             Balance Exercises - 12/10/15 1349      Balance Exercises: Standing   Standing Eyes Opened Narrow base of support (BOS);Wide (BOA);Head turns;Foam/compliant surface;5 reps;10 secs;30 secs   Standing Eyes Closed Narrow base of support (BOS);Wide (BOA);Head turns;Solid surface;5 reps;10 secs;30 secs   Other Standing Exercises Performed in corner with chair in front of pt and S for safety. Cues for technique. Please see pt instructions for HEP details.  PT Education - 12/10/15 1350    Education provided Yes   Education Details PT educated pt on SOT results. PT provided pt with balance HEP to improve vestibular input.    Person(s) Educated Patient   Methods Explanation;Demonstration;Verbal cues;Handout   Comprehension Returned demonstration;Verbalized understanding          PT Short Term Goals - 12/03/15 1328      PT SHORT TERM GOAL #1   Title same as STGs.            PT Long Term Goals - 12/10/15 1354      PT LONG TERM GOAL #1   Title Pt will be IND in HEP in order to reduce dizziness and improve balance. TARGET DATE FOR ALL LTGS: 12/31/15   Status On-going     PT LONG TERM GOAL #2   Title Perform SOT and write goal if  appropriate.    Baseline No goal needed, as composite score WNL.   Status Achieved     PT LONG TERM GOAL #3   Title Pt will improve FGA score to >/=27/30 to decr. falls risk.   Status On-going     PT LONG TERM GOAL #4   Title Pt will amb. 1000', over even/uneven terrain, while performing head turns with dizziness not incr. >2 points to improve functional mobility.   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    Status On-going               Plan - 12/10/15 1351    Clinical Impression Statement Pt's SOT composite score WNL. Pt's SOT sensory analysis also WNL. However, pt experienced 2 trials below normal for her age group (40-49y/o) with eyes closed and platform moving, which requires incr. vestibular input. Pt also experienced incr. postural sway during activities which required incr. vestibular input during balance activities. Pt denied dizziness during session but stated she felt unsteady at times. Continue with POC.    Rehab Potential Good   Clinical Impairments Affecting Rehab Potential co-morbidities   PT Frequency 2x / week   PT Duration 4 weeks   PT Treatment/Interventions ADLs/Self Care Home Management;Biofeedback;Canalith Repostioning;Neuromuscular re-education;Cognitive remediation;Balance training;Therapeutic exercise;Therapeutic activities;Manual techniques;Functional mobility training;Stair training;Gait training;DME Instruction;Patient/family education;Vestibular   PT Next Visit Plan Continue balance and gait training to improve vestibular input.    PT Home Exercise Plan Balance HEP.    Consulted and Agree with Plan of Care Patient      Patient will benefit from skilled therapeutic intervention in order to improve the following deficits and impairments:  Abnormal gait, Decreased activity tolerance, Decreased mobility, Decreased balance,  Dizziness  Visit Diagnosis: Dizziness and giddiness  Other abnormalities of gait and mobility     Problem List Patient Active Problem List   Diagnosis Date Noted  . Depression with anxiety 05/08/2011  . GERD 10/09/2006  . SYMPTOM, EDEMA 10/09/2006    Sharene Krikorian L 12/10/2015, 1:55 PM  [facility] [address] [place], [place], [address] Phone: [phone]   Fax:  [phone]  Name: Kristy [name] MRN: [id number] Date of Birth: [birth year]  Zerita Boers, PT,DPT 12/10/15 1:57 PM Phone: [phone] Fax: [phone]

## 2015-12-13 ENCOUNTER — Ambulatory Visit: Payer: 59

## 2015-12-17 ENCOUNTER — Encounter: Payer: Self-pay | Admitting: Rehabilitation

## 2015-12-17 ENCOUNTER — Ambulatory Visit: Payer: 59 | Admitting: Rehabilitation

## 2015-12-17 DIAGNOSIS — R2689 Other abnormalities of gait and mobility: Secondary | ICD-10-CM

## 2015-12-17 DIAGNOSIS — R42 Dizziness and giddiness: Secondary | ICD-10-CM

## 2015-12-17 NOTE — Therapy (Signed)
[facility] [address] [place], [place], [address] Phone: [phone]   Fax:  [phone]  Physical Therapy Treatment  Patient Details  Name: Kristy [name] MRN: [id number] Date of Birth: 1971-01-24 Referring Provider: Dr. Everlena Cooper  Encounter Date: 12/17/2015      PT End of Session - 12/17/15 0937    Visit Number 3   Number of Visits 9   Date for PT Re-Evaluation 01/02/16   Authorization Type [employer]-60 visit combined limit   PT Start Time 0930   PT Stop Time 1015   PT Time Calculation (min) 45 min   Equipment Utilized During Treatment --    Activity Tolerance Patient tolerated treatment well   Behavior During Therapy [facility] for tasks assessed/performed      Past Medical History:  Diagnosis Date  . Anxiety   . GERD (gastroesophageal reflux disease)   . Wears contact lenses     Past Surgical History:  Procedure Laterality Date  . CESAREAN SECTION    . SHOULDER ACROMIOPLASTY Left 06/24/2014   Procedure: SHOULDER ACROMIOPLASTY;  Surgeon: Frederico Hamman, MD;  Location: [facility];  Service: Orthopedics;  Laterality: Left;  . SHOULDER ARTHROSCOPY Left 06/24/2014   Procedure: ARTHROSCOPY LEFT SHOULDER WITH MANIPULATION AND LYSIS OF ADHESIONS;  Surgeon: Frederico Hamman, MD;  Location:  SURGERY CENTER;  Service: Orthopedics;  Laterality: Left;  . WISDOM TOOTH EXTRACTION      There were no vitals filed for this visit.      Subjective Assessment - 12/17/15 0936    Subjective Reports dizziness is improved, but still has episodes, esp at the end of the day.  Reports she returned to work, and is doing more physically but not getting up on ladders.    Pertinent History Anxiety, ADD   Patient Stated Goals To be able to function without dizziness.    Currently in Pain? No/denies            NMR:  Gait with moving ball in circular pattern clockwise x 115' and counter clockwise x 115' moving head  and eyes with ball.  Note slight increase in dizziness when moving in clockwise motion.  Turning in 180 deg R direction x 5 reps and L reps, no increase in dizziness noted, mild LOB (pt able to self stabilize) during last rep.  Moving in diagonal patterns from L upward and downward R to ground, again following ball in hands with eyes/head.  Note increased dizziness moving up L and down R.  Continued vestibular challenge while standing on mat on ramp performing marching task x 10 reps>tandem stance with head turns side to side and EC w/ head turns side/side x 10 reps each, repeated with alternate leg in front.  Gait x 50' x 4 reps while performing head turns side/side and up/down (with two steps in between initially progressing to increase speed of head turn to increase challenge).  Note increased difficulty with vertical head movements with mild staggering, but no overt LOB.  Simulated work activities during session climbing on step ladder initially placing cones from counter top level to top cabinet.  Pt able to perform without UE support.   Progressed to climbing ladder to simulate paining ceiling, again, no LOB noted.  No dizziness.  Seated on physioball while moving vertically performing head turns side/side with no c/o dizziness progressing to jumping on trampoline with ball toss x 15 reps.  Again, no dizziness, single instance of instability, but able to self correct.  PT Education - 12/17/15 [phone]    Education provided Yes   Education Details addition of head turns diagonally in corner on pillow w/ EC, see pt instruction   Person(s) Educated Patient   Methods Explanation   Comprehension Verbalized understanding          PT Short Term Goals - 12/03/15 1328      PT SHORT TERM GOAL #1   Title same as STGs.            PT Long Term Goals - 12/10/15 1354      PT LONG TERM GOAL #1   Title Pt will be IND in HEP in order to reduce dizziness and improve  balance. TARGET DATE FOR ALL LTGS: 12/31/15   Status On-going     PT LONG TERM GOAL #2   Title Perform SOT and write goal if appropriate.    Baseline No goal needed, as composite score WNL.   Status Achieved     PT LONG TERM GOAL #3   Title Pt will improve FGA score to >/=27/30 to decr. falls risk.   Status On-going     PT LONG TERM GOAL #4   Title Pt will amb. 1000', over even/uneven terrain, while performing head turns with dizziness not incr. >2 points to improve functional mobility.   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    Status On-going               Plan - 12/17/15 1243    Clinical Impression Statement Skilled session focused on gait and mobility with vestibular challenges.  Note that turning head upward direction, either R or L (depending on task) seems to increase dizziness the most.  Dizziness at 4/10 at most during session, but with rest quickly recovers.  Continue to note mild instability with gait when performing head motions.     Rehab Potential Good   Clinical Impairments Affecting Rehab Potential co-morbidities   PT Frequency 2x / week   PT Duration 4 weeks   PT Treatment/Interventions ADLs/Self Care Home Management;Biofeedback;Canalith Repostioning;Neuromuscular re-education;Cognitive remediation;Balance training;Therapeutic exercise;Therapeutic activities;Manual techniques;Functional mobility training;Stair training;Gait training;DME Instruction;Patient/family education;Vestibular   PT Next Visit Plan Continue balance and gait training to improve vestibular input.    PT Home Exercise Plan Balance HEP.    Consulted and Agree with Plan of Care Patient      Patient will benefit from skilled therapeutic intervention in order to improve the following deficits and impairments:  Abnormal gait, Decreased activity tolerance, Decreased mobility,  Decreased balance, Dizziness  Visit Diagnosis: Dizziness and giddiness  Other abnormalities of gait and mobility     Problem List Patient Active Problem List   Diagnosis Date Noted  . Depression with anxiety 05/08/2011  . GERD 10/09/2006  . SYMPTOM, EDEMA 10/09/2006    Harriet ButteEmily Kaelene [name], PT, MPT [facility] [address] [place], [place], [address] Phone: [phone]   Fax:  [phone] 12/17/15, 12:53 PM  Name: Kristy Reilmily W Jennings MRN: [id number] Date of Birth: 04-26-1970

## 2015-12-17 NOTE — Patient Instructions (Signed)
Perform in a corner with chair in front of you OR at kitchen sink with chair behind you:  Feet Together (Compliant Surface) Head Motion - Eyes Closed    Stand on compliant surface: _pillow/cushion_______ with feet together. Close eyes and move head slowly, up and down 5 times and side to side 5 times.  Also add diagonal head movements in each direction x 5 reps each.   Repeat __3__ times per session. Do __1__ sessions per day.  Copyright  VHI. All rights reserved.  Feet Together (Compliant Surface) Varied Arm Positions - Eyes Closed    Stand on compliant surface: ___pillow/cushion_____ with feet together and arms at your side. Close eyes and visualize upright position. Hold__30__ seconds. Repeat __3__ times per session. Do __1__ sessions per day.  Copyright  VHI. All rights reserved.  Side to Side Head Motion    Perform without assistive device. Walking on solid surface, turn head and eyes to left for __2__ steps. Then, turn head and eyes to opposite side for __2__ steps. Repeat sequence __4__ times per session. Do _1___ sessions per day.  Copyright  VHI. All rights reserved.  Up / Down Head Motion    Perform without assistive device. Walking on solid surface, move head and eyes toward ceiling for __2__ steps.  Then, move head and eyes toward floor for __2__ steps. Repeat __4__ times per session. Do __1__ sessions per day.  Copyright  VHI. All rights reserved.  Walking    Perform at [employer]. Walk on solid surface with hand close to support with eyes closed. Attempt to keep hand away from support for longer periods of time, keeping a straight path. Repeat __4__ times per session. Do __1__ sessions per day.   Copyright  VHI. All rights reserved.

## 2015-12-21 ENCOUNTER — Encounter: Payer: 59 | Admitting: Physical Therapy

## 2015-12-23 ENCOUNTER — Ambulatory Visit: Payer: 59 | Admitting: Rehabilitation

## 2015-12-30 ENCOUNTER — Ambulatory Visit: Payer: 59 | Admitting: Rehabilitation

## 2016-01-05 ENCOUNTER — Telehealth: Payer: Self-pay

## 2016-01-05 ENCOUNTER — Ambulatory Visit: Payer: 59 | Attending: Neurology

## 2016-01-05 NOTE — Telephone Encounter (Signed)
PT called and left pt a msg regarding no-show to 01/05/16 appt. And to remind pt she has another PT appt. Tomorrow (01/06/16) at 0845.  Zerita BoersJennifer Timiko Offutt, PT,DPT 01/05/16 12:21 PM Phone: [phone] Fax: [phone]

## 2016-01-06 ENCOUNTER — Ambulatory Visit: Payer: 59

## 2016-01-06 NOTE — Therapy (Signed)
[facility] [address] [place], [place], [address] Phone: [phone]   Fax:  [phone]  Patient Details  Name: Kristy [name] MRN: [id number] Date of Birth: 17-May-1970 Referring Provider:  No ref. provider found  Encounter Date: 01/06/2016  PHYSICAL THERAPY DISCHARGE SUMMARY  Visits from Start of Care: 3  Current functional level related to goals / functional outcomes:     PT Long Term Goals - 12/10/15 1354      PT LONG TERM GOAL #1   Title Pt will be IND in HEP in order to reduce dizziness and improve balance. TARGET DATE FOR ALL LTGS: 12/31/15   Status On-going     PT LONG TERM GOAL #2   Title Perform SOT and write goal if appropriate.    Baseline No goal needed, as composite score WNL.   Status Achieved     PT LONG TERM GOAL #3   Title Pt will improve FGA score to >/=27/30 to decr. falls risk.   Status On-going     PT LONG TERM GOAL #4   Title Pt will amb. 1000', over even/uneven terrain, while performing head turns with dizziness not incr. >2 points to improve functional mobility.   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    Status On-going        Remaining deficits: Unknown, as pt did not return since last visit. Pt did state she was feeling better last session. PT called and left msg regarding no-shows to last appt's, but pt did not call back.  If pt would like to resume therapy she would require a new order.   Education / Equipment: HEP  Plan: Patient agrees to discharge.  Patient goals were not met. Patient is being discharged due to not returning since the last visit.  ?????       Mark Benecke L 01/06/2016, 9:12 AM  [facility] [address] [place], [place], [address]  Phone: [phone]   Fax:   [phone]   Geoffry Paradise, PT,DPT 01/06/16 9:14 AM Phone: [phone] Fax: [phone]

## 2016-01-11 ENCOUNTER — Other Ambulatory Visit: Payer: Self-pay | Admitting: Family Medicine

## 2016-01-11 DIAGNOSIS — E010 Iodine-deficiency related diffuse (endemic) goiter: Secondary | ICD-10-CM

## 2016-01-19 ENCOUNTER — Other Ambulatory Visit: Payer: 59

## 2016-01-20 ENCOUNTER — Other Ambulatory Visit: Payer: Self-pay | Admitting: Family Medicine

## 2016-01-20 DIAGNOSIS — E010 Iodine-deficiency related diffuse (endemic) goiter: Secondary | ICD-10-CM

## 2016-01-25 ENCOUNTER — Ambulatory Visit
Admission: RE | Admit: 2016-01-25 | Discharge: 2016-01-25 | Disposition: A | Discharge status: Home / Self Care | Payer: 59 | Source: Ambulatory Visit | Attending: Family Medicine | Admitting: Family Medicine

## 2016-01-25 DIAGNOSIS — E010 Iodine-deficiency related diffuse (endemic) goiter: Secondary | ICD-10-CM

## 2016-02-28 ENCOUNTER — Ambulatory Visit: Payer: 59 | Admitting: Neurology

## 2017-04-28 DIAGNOSIS — S61011A Laceration without foreign body of right thumb without damage to nail, initial encounter: Secondary | ICD-10-CM | POA: Diagnosis not present

## 2017-05-21 DIAGNOSIS — M542 Cervicalgia: Secondary | ICD-10-CM | POA: Diagnosis not present

## 2017-05-21 DIAGNOSIS — M9901 Segmental and somatic dysfunction of cervical region: Secondary | ICD-10-CM | POA: Diagnosis not present

## 2017-05-21 DIAGNOSIS — M4712 Other spondylosis with myelopathy, cervical region: Secondary | ICD-10-CM | POA: Diagnosis not present

## 2017-06-11 DIAGNOSIS — Z79899 Other long term (current) drug therapy: Secondary | ICD-10-CM | POA: Diagnosis not present

## 2017-08-20 DIAGNOSIS — M25561 Pain in right knee: Secondary | ICD-10-CM | POA: Diagnosis not present

## 2017-08-20 DIAGNOSIS — M25512 Pain in left shoulder: Secondary | ICD-10-CM | POA: Diagnosis not present

## 2017-08-30 DIAGNOSIS — J029 Acute pharyngitis, unspecified: Secondary | ICD-10-CM | POA: Diagnosis not present

## 2017-09-04 DIAGNOSIS — Z79899 Other long term (current) drug therapy: Secondary | ICD-10-CM | POA: Diagnosis not present

## 2017-12-24 DIAGNOSIS — Z01419 Encounter for gynecological examination (general) (routine) without abnormal findings: Secondary | ICD-10-CM | POA: Diagnosis not present

## 2017-12-24 DIAGNOSIS — Z6824 Body mass index (BMI) 24.0-24.9, adult: Secondary | ICD-10-CM | POA: Diagnosis not present

## 2018-01-01 DIAGNOSIS — Z79899 Other long term (current) drug therapy: Secondary | ICD-10-CM | POA: Diagnosis not present

## 2018-04-09 DIAGNOSIS — Z79899 Other long term (current) drug therapy: Secondary | ICD-10-CM | POA: Diagnosis not present

## 2018-04-16 ENCOUNTER — Ambulatory Visit: Payer: 59 | Admitting: Family Medicine

## 2018-04-24 ENCOUNTER — Ambulatory Visit: Payer: 59 | Admitting: Family Medicine

## 2018-05-03 ENCOUNTER — Telehealth: Payer: Self-pay | Admitting: Family Medicine

## 2018-05-03 ENCOUNTER — Encounter: Payer: Self-pay | Admitting: Family Medicine

## 2018-05-03 ENCOUNTER — Other Ambulatory Visit: Payer: Self-pay

## 2018-05-03 ENCOUNTER — Ambulatory Visit: Payer: 59 | Admitting: Family Medicine

## 2018-05-03 VITALS — BP 110/76 | HR 91 | Temp 98.1°F | Ht 65.0 in | Wt 140.0 lb

## 2018-05-03 DIAGNOSIS — K625 Hemorrhage of anus and rectum: Secondary | ICD-10-CM

## 2018-05-03 DIAGNOSIS — Z7689 Persons encountering health services in other specified circumstances: Secondary | ICD-10-CM | POA: Diagnosis not present

## 2018-05-03 DIAGNOSIS — Z23 Encounter for immunization: Secondary | ICD-10-CM

## 2018-05-03 DIAGNOSIS — K649 Unspecified hemorrhoids: Secondary | ICD-10-CM | POA: Diagnosis not present

## 2018-05-03 DIAGNOSIS — F988 Other specified behavioral and emotional disorders with onset usually occurring in childhood and adolescence: Secondary | ICD-10-CM

## 2018-05-03 LAB — CBC WITH DIFFERENTIAL/PLATELET
Basophils Absolute: 0 10*3/uL (ref 0.0–0.1)
Basophils Relative: 0.6 % (ref 0.0–3.0)
Eosinophils Absolute: 0.1 10*3/uL (ref 0.0–0.7)
Eosinophils Relative: 1.4 % (ref 0.0–5.0)
HEMATOCRIT: 38.7 % (ref 36.0–46.0)
Hemoglobin: 13.4 g/dL (ref 12.0–15.0)
Lymphocytes Relative: 31.6 % (ref 12.0–46.0)
Lymphs Abs: 1.3 10*3/uL (ref 0.7–4.0)
MCHC: 34.7 g/dL (ref 30.0–36.0)
MCV: 89.5 fl (ref 78.0–100.0)
MONOS PCT: 11.4 % (ref 3.0–12.0)
Monocytes Absolute: 0.5 10*3/uL (ref 0.1–1.0)
Neutro Abs: 2.3 10*3/uL (ref 1.4–7.7)
Neutrophils Relative %: 55 % (ref 43.0–77.0)
Platelets: 208 10*3/uL (ref 150.0–400.0)
RBC: 4.32 Mil/uL (ref 3.87–5.11)
RDW: 12.5 % (ref 11.5–15.5)
WBC: 4.2 10*3/uL (ref 4.0–10.5)

## 2018-05-03 MED ORDER — HYDROCORTISONE ACETATE 25 MG RE SUPP: 25.0000 mg | Freq: Two times a day (BID) | RECTAL | 0 refills | Status: DC

## 2018-05-03 NOTE — Progress Notes (Signed)
Patient presents to clinic today to establish care.  SUBJECTIVE: PMH:  Pt is a 48 yo female with pmh sig for ADD.  Pt previously seen at [facility] by Cain Saupe, MD.  ADD -followed by Marisue Brooklyn at [facility] -taking Cymbalta, Xanax, and Mydasis 36 mg -was on adderall in the past -notes perimenopause is making her symptoms worse.  Hemorrhoids: -Endorses bleeding x6 months -Denies dizziness, palpitations, fatigue -notes bright red blood in toilet -now only noticing bright red blood on toilet paper -has not taking anything -denies constipation.  Having BM daily, denies straining, N/V, dizziness, palpitations.  Allergies: NKDA  Past surgical history: Procedure for frozen shoulder, L  2016 C-section-2006  Social history: Patient is going through separation.  Patient works as a [employer] houses.  Patient endorses social alcohol use.  Patient denies tobacco and drug use.  Family medical history: mom-HLD Dad-diabetes, heart disease MGM-breast cancer PGM-Parkinson's disease  PGF-prostate cancer, heart disease  Health Maintenance: Mammogram --January 2020 PAP --January 2020   Past Medical History:  Diagnosis Date  . Anxiety   . GERD (gastroesophageal reflux disease)   . Wears contact lenses     Past Surgical History:  Procedure Laterality Date  . CESAREAN SECTION    . SHOULDER ACROMIOPLASTY Left 06/24/2014   Procedure: SHOULDER ACROMIOPLASTY;  Surgeon: Frederico Hamman, MD;  Location: [facility];  Service: Orthopedics;  Laterality: Left;  . SHOULDER ARTHROSCOPY Left 06/24/2014   Procedure: ARTHROSCOPY LEFT SHOULDER WITH MANIPULATION AND LYSIS OF ADHESIONS;  Surgeon: Frederico Hamman, MD;  Location: [facility];  Service: Orthopedics;  Laterality: Left;  . WISDOM TOOTH EXTRACTION      Current Outpatient Medications on File Prior to Visit  Medication Sig Dispense Refill  .  DULoxetine (CYMBALTA) 60 MG capsule Take 60 mg by mouth daily.    Marland Kitchen ibuprofen (ADVIL,MOTRIN) 200 MG tablet Take 400-600 mg by mouth every 6 (six) hours as needed for headache, mild pain or moderate pain.    . pantoprazole (PROTONIX) 40 MG tablet Take 1 tablet (40 mg total) by mouth daily. 90 tablet 1   No current facility-administered medications on file prior to visit.     No Known Allergies  Family History  Problem Relation Age of Onset  . Hyperlipidemia Mother   . Diabetes Father     Social History   Socioeconomic History  . Marital status: Married    Spouse name: Not on file  . Number of children: Not on file  . Years of education: Not on file  . Highest education level: Not on file  Occupational History  . Not on file  Social Needs  . Financial resource strain: Not on file  . Food insecurity:    Worry: Not on file    Inability: Not on file  . Transportation needs:    Medical: Not on file    Non-medical: Not on file  Tobacco Use  . Smoking status: Former Smoker    Last attempt to quit: 06/19/1998    Years since quitting: 19.8  . Smokeless tobacco: Never Used  . Tobacco comment: quit 11  years ago  Substance and Sexual Activity  . Alcohol use: Yes    Comment: occ  . Drug use: No  . Sexual activity: Not on file  Lifestyle  . Physical activity:    Days per week: Not on file    Minutes per session: Not on file  . Stress: Not  on file  Relationships  . Social connections:    Talks on phone: Not on file    Gets together: Not on file    Attends religious service: Not on file    Active member of club or organization: Not on file    Attends meetings of clubs or organizations: Not on file    Relationship status: Not on file  . Intimate partner violence:    Fear of current or ex partner: Not on file    Emotionally abused: Not on file    Physically abused: Not on file    Forced sexual activity: Not on file  Other Topics Concern  . Not on file  Social History  Narrative  . Not on file    ROS General: Denies fever, chills, night sweats, changes in weight, changes in appetite HEENT: Denies headaches, ear pain, changes in vision, rhinorrhea, sore throat CV: Denies CP, palpitations, SOB, orthopnea Pulm: Denies SOB, cough, wheezing GI: Denies abdominal pain, nausea, vomiting, diarrhea, constipation  +Rectal bleeding, hemorrhoids GU: Denies dysuria, hematuria, frequency, vaginal discharge Msk: Denies muscle cramps, joint pains Neuro: Denies weakness, numbness, tingling Skin: Denies rashes, bruising Psych: Denies depression, anxiety, hallucinations   BP 110/76 (BP Location: Left Arm, Patient Position: Sitting, Cuff Size: Normal)   Pulse 91   Temp 98.1 F (36.7 C) (Oral)   Ht 5\' 5"  (1.651 m)   Wt 140 lb (63.5 kg)   LMP 01/31/2017 Comment: pt has an Ablation  SpO2 98%   BMI 23.30 kg/m   Physical Exam Gen. Pleasant, well developed, well-nourished, in NAD HEENT - [place]/AT, PERRL, no scleral icterus, no nasal drainage, pharynx without erythema or exudate. Lungs: no use of accessory muscles, CTAB, no wheezes, rales or rhonchi Cardiovascular: RRR, No r/g/m, no peripheral edema Abdomen: BS present, soft, nontender,nondistended GU: No external lesions.  Small hemorrhoid noted externally, normal rectal tone, no stool in rectal vault. No internal hemorrhoid or masses appreciated.  Guaiac positive.  Chaperone present: Mable Paris, CMA Neuro:  A&Ox3, CN II-XII intact, normal gait Skin:  Warm, dry, intact, no lesions  No results found for this or any previous visit (from the past 2160 hour(s)).  Assessment/Plan: Hemorrhoids, unspecified hemorrhoid type  -given handout -Plan: hydrocortisone (ANUSOL-HC) 25 MG suppository, Ambulatory referral to Gastroenterology  Rectal bleeding  -asymptomatic -Guaiac positive -will obtain CBC given duration of bleeing -Given RTC or ED precautions - Plan: Ambulatory referral to Gastroenterology, CBC with  Differential/Platelet  Attention deficit disorder (ADD) without hyperactivity -Continue current meds -Continue follow-up with [facility], Amy Elisabeth Most  Encounter to establish care -We reviewed the PMH, PSH, FH, SH, Meds and Allergies. -We provided refills for any medications we will prescribe as needed. -We addressed current concerns per orders and patient instructions. -We have asked for records for pertinent exams, studies, vaccines and notes from previous providers. -We have advised patient to follow up per instructions below.  Needs flu shot  - Plan: Flu Vaccine QUAD 36+ mos IM   F/u prn in the next few weeks for CPE  Abbe Amsterdam, MD

## 2018-05-03 NOTE — Patient Instructions (Signed)
Gastrointestinal Bleeding Gastrointestinal (GI) bleeding is bleeding somewhere along the digestive tract, between the mouth and anus. This can be caused by various problems. The severity of these problems can range from mild to serious or even life-threatening. If you have GI bleeding, you may find blood in your stools (feces), you may have black stools, or you may vomit blood. If there is a lot of bleeding, you may need to stay in the hospital. What are the causes? This condition may be caused by:  Esophagitis. This is inflammation, irritation, or swelling of the esophagus.  Hemorrhoids.These are swollen veins in the rectum.  Anal fissures.These are areas of painful tearing that are often caused by passing hard stool.  Diverticulosis.These are pouches that form on the colon over time, with age, and may bleed a lot.  Diverticulitis.This is inflammation in areas with diverticulosis. It can cause pain, fever, and bloody stools, although bleeding may be mild.  Polyps and cancer. Colon cancer often starts out as precancerous polyps.  Gastritis and ulcers. With these, bleeding may come from the upper GI tract, near the stomach. What are the signs or symptoms? Symptoms of this condition may include:  Bright red blood in your vomit, or vomit that looks like coffee grounds.  Bloody, black, or tarry stools. ? Bleeding from the lower GI tract will usually cause red or maroon blood in the stools. ? Bleeding from the upper GI tract may cause black, tarry, often bad-smelling stools. ? In certain cases, if the bleeding is fast enough, the stools may be red.  Pain or cramping in the abdomen. How is this diagnosed? This condition may be diagnosed based on:  Medical history and physical exam.  Various tests, such as: ? Blood tests. ? X-rays and other imaging tests. ? Esophagogastroduodenoscopy (EGD). In this test, a flexible, lighted tube is used to look at your esophagus, stomach, and small  intestine. ? Colonoscopy. In this test, a flexible, lighted tube is used to look at your colon. How is this treated? Treatment for this condition depends on the cause of the bleeding. For example:  For bleeding from the esophagus, stomach, small intestine, or colon, the health care provider doing your EGD or colonoscopy may be able to stop the bleeding as part of the procedure.  Inflammation or infection of the colon can be treated with medicines.  Certain rectal problems can be treated with creams, suppositories, or warm baths.  Surgery is sometimes needed.  Blood transfusions are sometimes needed if a lot of blood has been lost. If bleeding is slow, you may be allowed to go home. If there is a lot of bleeding, you will need to stay in the hospital for observation. Follow these instructions at home:   Take over-the-counter and prescription medicines only as told by your health care provider.  Eat foods that are high in fiber. This will help to keep your stools soft. These foods include whole grains, legumes, fruits, and vegetables. Eating 1-3 prunes each day works well for many people.  Drink enough fluid to keep your urine clear or pale yellow.  Keep all follow-up visits as told by your health care provider. This is important. Contact a health care provider if:  Your symptoms do not improve. Get help right away if:  Your bleeding increases.  You feel light-headed or you faint.  You feel weak.  You have severe cramps in your back or abdomen.  You pass large blood clots in your stool.  Your symptoms  are getting worse. This information is not intended to replace advice given to you by your health care provider. Make sure you discuss any questions you have with your health care provider. Document Released: 03/03/2000 Document Revised: 08/04/2015 Document Reviewed: 08/24/2014 Elsevier Interactive Patient Education  2019 [employer].  Perimenopause  Perimenopause is the  normal time of life before and after menstrual periods stop completely (menopause). Perimenopause can begin 2-8 years before menopause, and it usually lasts for 1 year after menopause. During perimenopause, the ovaries may or may not produce an egg. What are the causes? This condition is caused by a natural change in hormone levels that happens as you get older. What increases the risk? This condition is more likely to start at an earlier age if you have certain medical conditions or treatments, including:  A tumor of the pituitary gland in the brain.  A disease that affects the ovaries and hormone production.  Radiation treatment for cancer.  Certain cancer treatments, such as chemotherapy or hormone (anti-estrogen) therapy.  Heavy smoking and excessive alcohol use.  Family history of early menopause. What are the signs or symptoms? Perimenopausal changes affect each woman differently. Symptoms of this condition may include:  Hot flashes.  Night sweats.  Irregular menstrual periods.  Decreased sex drive.  Vaginal dryness.  Headaches.  Mood swings.  Depression.  Memory problems or trouble concentrating.  Irritability.  Tiredness.  Weight gain.  Anxiety.  Trouble getting pregnant. How is this diagnosed? This condition is diagnosed based on your medical history, a physical exam, your age, your menstrual history, and your symptoms. Hormone tests may also be done. How is this treated? In some cases, no treatment is needed. You and your health care provider should make a decision together about whether treatment is necessary. Treatment will be based on your individual condition and preferences. Various treatments are available, such as:  Menopausal hormone therapy (MHT).  Medicines to treat specific symptoms.  Acupuncture.  Vitamin or herbal supplements. Before starting treatment, make sure to let your health care provider know if you have a personal or family  history of:  Heart disease.  Breast cancer.  Blood clots.  Diabetes.  Osteoporosis. Follow these instructions at home: Lifestyle  Do not use any products that contain nicotine or tobacco, such as cigarettes and e-cigarettes. If you need help quitting, ask your health care provider.  Eat a balanced diet that includes fresh fruits and vegetables, whole grains, soybeans, eggs, lean meat, and low-fat dairy.  Get at least 30 minutes of physical activity on 5 or more days each week.  Avoid alcoholic and caffeinated beverages, as well as spicy foods. This may help prevent hot flashes.  Get 7-8 hours of sleep each night.  Dress in layers that can be removed to help you manage hot flashes.  Find ways to manage stress, such as deep breathing, meditation, or journaling. General instructions  Keep track of your menstrual periods, including: ? When they occur. ? How heavy they are and how long they last. ? How much time passes between periods.  Keep track of your symptoms, noting when they start, how often you have them, and how long they last.  Take over-the-counter and prescription medicines only as told by your health care provider.  Take vitamin supplements only as told by your health care provider. These may include calcium, vitamin E, and vitamin D.  Use vaginal lubricants or moisturizers to help with vaginal dryness and improve comfort during sex.  Talk  with your health care provider before starting any herbal supplements.  Keep all follow-up visits as told by your health care provider. This is important. This includes any group therapy or counseling. Contact a health care provider if:  You have heavy vaginal bleeding or pass blood clots.  Your period lasts more than 2 days longer than normal.  Your periods are recurring sooner than 21 days.  You bleed after having sex. Get help right away if:  You have chest pain, trouble breathing, or trouble talking.  You have  severe depression.  You have pain when you urinate.  You have severe headaches.  You have vision problems. Summary  Perimenopause is the time when a woman's body begins to move into menopause. This may happen naturally or as a result of other health problems or medical treatments.  Perimenopause can begin 2-8 years before menopause, and it usually lasts for 1 year after menopause.  Perimenopausal symptoms can be managed through medicines, lifestyle changes, and complementary therapies such as acupuncture. This information is not intended to replace advice given to you by your health care provider. Make sure you discuss any questions you have with your health care provider. Document Released: 04/13/2004 Document Revised: 04/11/2016 Document Reviewed: 04/11/2016 Elsevier Interactive Patient Education  2019 [employer]

## 2018-05-03 NOTE — Telephone Encounter (Signed)
The patient needs pantoprazole (PROTONIX) 40 MG tablet  Removed from her list and needs Mydayis 30mg  take 1xdaily  Xanax 2.5 mg as needed added to her list

## 2018-05-07 NOTE — Telephone Encounter (Signed)
This issue has been resolved.

## 2018-05-10 ENCOUNTER — Encounter: Payer: Self-pay | Admitting: Gastroenterology

## 2018-05-15 ENCOUNTER — Ambulatory Visit (INDEPENDENT_AMBULATORY_CARE_PROVIDER_SITE_OTHER): Payer: 59 | Admitting: Family Medicine

## 2018-05-15 ENCOUNTER — Encounter: Payer: Self-pay | Admitting: Family Medicine

## 2018-05-15 VITALS — BP 98/68 | HR 80 | Temp 98.4°F | Wt 139.0 lb

## 2018-05-15 DIAGNOSIS — Z13220 Encounter for screening for lipoid disorders: Secondary | ICD-10-CM

## 2018-05-15 DIAGNOSIS — Z0001 Encounter for general adult medical examination with abnormal findings: Secondary | ICD-10-CM | POA: Diagnosis not present

## 2018-05-15 DIAGNOSIS — S161XXA Strain of muscle, fascia and tendon at neck level, initial encounter: Secondary | ICD-10-CM | POA: Diagnosis not present

## 2018-05-15 DIAGNOSIS — L0100 Impetigo, unspecified: Secondary | ICD-10-CM

## 2018-05-15 DIAGNOSIS — Z131 Encounter for screening for diabetes mellitus: Secondary | ICD-10-CM | POA: Diagnosis not present

## 2018-05-15 DIAGNOSIS — Z0000 Encounter for general adult medical examination without abnormal findings: Secondary | ICD-10-CM

## 2018-05-15 LAB — BASIC METABOLIC PANEL
BUN: 13 mg/dL (ref 6–23)
CALCIUM: 9.2 mg/dL (ref 8.4–10.5)
CO2: 32 mEq/L (ref 19–32)
Chloride: 101 mEq/L (ref 96–112)
Creatinine, Ser: 0.87 mg/dL (ref 0.40–1.20)
GFR: 69.54 mL/min (ref >60.00)
GLUCOSE: 97 mg/dL (ref 70–99)
POTASSIUM: 4.6 meq/L (ref 3.5–5.1)
Sodium: 140 mEq/L (ref 135–145)

## 2018-05-15 LAB — CBC WITH DIFFERENTIAL/PLATELET
BASOS PCT: 0.2 % (ref 0.0–3.0)
Basophils Absolute: 0 10*3/uL (ref 0.0–0.1)
EOS ABS: 0.1 10*3/uL (ref 0.0–0.7)
EOS PCT: 1.3 % (ref 0.0–5.0)
HCT: 38.6 % (ref 36.0–46.0)
Hemoglobin: 13.6 g/dL (ref 12.0–15.0)
LYMPHS ABS: 1.4 10*3/uL (ref 0.7–4.0)
Lymphocytes Relative: 19.3 % (ref 12.0–46.0)
MCHC: 35.2 g/dL (ref 30.0–36.0)
MCV: 89.9 fl (ref 78.0–100.0)
MONO ABS: 0.8 10*3/uL (ref 0.1–1.0)
Monocytes Relative: 10.3 % (ref 3.0–12.0)
NEUTROS ABS: 5.2 10*3/uL (ref 1.4–7.7)
Neutrophils Relative %: 68.9 % (ref 43.0–77.0)
PLATELETS: 193 10*3/uL (ref 150.0–400.0)
RBC: 4.3 Mil/uL (ref 3.87–5.11)
RDW: 12.6 % (ref 11.5–15.5)
WBC: 7.5 10*3/uL (ref 4.0–10.5)

## 2018-05-15 LAB — LIPID PANEL
CHOL/HDL RATIO: 2
Cholesterol: 172 mg/dL (ref 0–200)
HDL: 87.3 mg/dL (ref >39.00)
LDL Cholesterol: 72 mg/dL (ref 0–99)
NonHDL: 84.42
Triglycerides: 64 mg/dL (ref 0.0–149.0)
VLDL: 12.8 mg/dL (ref 0.0–40.0)

## 2018-05-15 LAB — HEMOGLOBIN A1C: HEMOGLOBIN A1C: 4.6 % (ref 4.6–6.5)

## 2018-05-15 MED ORDER — MUPIROCIN 2 % EX OINT: 1.0000 "application " | TOPICAL_OINTMENT | Freq: Two times a day (BID) | CUTANEOUS | 0 refills | Status: DC

## 2018-05-15 NOTE — Progress Notes (Signed)
Subjective:     Kristy [name] is a 48 y.o. female and is here for a comprehensive physical exam. The patient reports problems - sores on nose.  Pt states she has been using Neosporin and vaseline with no change.  Otherwise pt is doing well.  Pt does note some neck pain after looking up to paint a ceiling.  Pt has been seeing a Land.    Social History   Socioeconomic History  . Marital status: Legally Separated    Spouse name: Not on file  . Number of children: Not on file  . Years of education: Not on file  . Highest education level: Not on file  Occupational History  . Not on file  Social Needs  . Financial resource strain: Not on file  . Food insecurity:    Worry: Not on file    Inability: Not on file  . Transportation needs:    Medical: Not on file    Non-medical: Not on file  Tobacco Use  . Smoking status: Former Smoker    Last attempt to quit: 06/19/1998    Years since quitting: 19.9  . Smokeless tobacco: Never Used  . Tobacco comment: quit 11  years ago  Substance and Sexual Activity  . Alcohol use: Yes    Comment: occ  . Drug use: No  . Sexual activity: Not on file  Lifestyle  . Physical activity:    Days per week: Not on file    Minutes per session: Not on file  . Stress: Not on file  Relationships  . Social connections:    Talks on phone: Not on file    Gets together: Not on file    Attends religious service: Not on file    Active member of club or organization: Not on file    Attends meetings of clubs or organizations: Not on file    Relationship status: Not on file  . Intimate partner violence:    Fear of current or ex partner: Not on file    Emotionally abused: Not on file    Physically abused: Not on file    Forced sexual activity: Not on file  Other Topics Concern  . Not on file  Social History Narrative  . Not on file   Health Maintenance  Topic Date Due  . PAP SMEAR-Modifier  02/14/2021  . TETANUS/TDAP  05/16/2026  . INFLUENZA  VACCINE  Completed  . HIV Screening  Completed    The following portions of the patient's history were reviewed and updated as appropriate: allergies, current medications, past family history, past medical history, past social history, past surgical history and problem list.  Review of Systems Pertinent items noted in HPI and remainder of comprehensive ROS otherwise negative.   Objective:    BP 98/68 (BP Location: Left Arm, Patient Position: Sitting, Cuff Size: Normal)   Pulse 80   Temp 98.4 F (36.9 C) (Oral)   Wt 139 lb (63 kg)   SpO2 98%   BMI 23.13 kg/m  General appearance: alert, cooperative and no distress Head: Normocephalic, without obvious abnormality, atraumatic Eyes: conjunctivae/corneas clear. PERRL, EOM's intact. Fundi benign. Ears: normal TM's and external ear canals both ears Nose: Nares normal. Septum midline. Mucosa normal. No drainage or sinus tenderness. Throat: lips, mucosa, and tongue normal; teeth and gums normal Neck: no adenopathy, no carotid bruit, no JVD, supple, symmetrical, trachea midline and thyroid not enlarged, symmetric, no tenderness/mass/nodules Lungs: clear to auscultation bilaterally normal effort, no accessory  muscle use. Heart: regular rate and rhythm, S1, S2 normal, no murmur, click, rub or gallop Abdomen: soft, non-tender; bowel sounds normal; no masses,  no organomegaly Extremities: extremities normal, atraumatic, no cyanosis or edema Skin: Skin color, texture, turgor normal. No rashes or lesions Lymph nodes: Cervical, supraclavicular, and axillary nodes normal. Neurologic: Alert and oriented X 3, normal strength and tone. Normal symmetric reflexes. Normal coordination and gait    Assessment:    Healthy female exam.      Plan:     Anticipatory guidance given including wearing seatbelts, smoke detectors in the home, increasing physical activity, increasing p.o. intake of water and vegetables. -will obtain labs -pap and mammogram up to  date. -given handout -next CPE in 1 yr See After Visit Summary for Counseling Recommendations    Impetigo -Rx for mupirocin given -Given handout  Neck strain -discussed supportive care -NSAIDs, massage, heat -consider using a lacrosse ball for deep tissue massage -discussed restarting yoga. -continue f/u with chiropractor.  Follow-up PRN  Abbe Amsterdam, MD

## 2018-05-15 NOTE — Patient Instructions (Signed)
Preventive Care 40-64 Years, Female Preventive care refers to lifestyle choices and visits with your health care provider that can promote health and wellness. What does preventive care include?   A yearly physical exam. This is also called an annual well check.  Dental exams once or twice a year.  Routine eye exams. Ask your health care provider how often you should have your eyes checked.  Personal lifestyle choices, including: ? Daily care of your teeth and gums. ? Regular physical activity. ? Eating a healthy diet. ? Avoiding tobacco and drug use. ? Limiting alcohol use. ? Practicing safe sex. ? Taking low-dose aspirin daily starting at age 50. ? Taking vitamin and mineral supplements as recommended by your health care provider. What happens during an annual well check? The services and screenings done by your health care provider during your annual well check will depend on your age, overall health, lifestyle risk factors, and family history of disease. Counseling Your health care provider may ask you questions about your:  Alcohol use.  Tobacco use.  Drug use.  Emotional well-being.  Home and relationship well-being.  Sexual activity.  Eating habits.  Work and work environment.  Method of birth control.  Menstrual cycle.  Pregnancy history. Screening You may have the following tests or measurements:  Height, weight, and BMI.  Blood pressure.  Lipid and cholesterol levels. These may be checked every 5 years, or more frequently if you are over 50 years old.  Skin check.  Lung cancer screening. You may have this screening every year starting at age 55 if you have a 30-pack-year history of smoking and currently smoke or have quit within the past 15 years.  Colorectal cancer screening. All adults should have this screening starting at age 50 and continuing until age 75. Your health care provider may recommend screening at age 45. You will have tests every  1-10 years, depending on your results and the type of screening test. People at increased risk should start screening at an earlier age. Screening tests may include: ? Guaiac-based fecal occult blood testing. ? Fecal immunochemical test (FIT). ? Stool DNA test. ? Virtual colonoscopy. ? Sigmoidoscopy. During this test, a flexible tube with a tiny camera (sigmoidoscope) is used to examine your rectum and lower colon. The sigmoidoscope is inserted through your anus into your rectum and lower colon. ? Colonoscopy. During this test, a long, thin, flexible tube with a tiny camera (colonoscope) is used to examine your entire colon and rectum.  Hepatitis C blood test.  Hepatitis B blood test.  Sexually transmitted disease (STD) testing.  Diabetes screening. This is done by checking your blood sugar (glucose) after you have not eaten for a while (fasting). You may have this done every 1-3 years.  Mammogram. This may be done every 1-2 years. Talk to your health care provider about when you should start having regular mammograms. This may depend on whether you have a family history of breast cancer.  BRCA-related cancer screening. This may be done if you have a family history of breast, ovarian, tubal, or peritoneal cancers.  Pelvic exam and Pap test. This may be done every 3 years starting at age 21. Starting at age 30, this may be done every 5 years if you have a Pap test in combination with an HPV test.  Bone density scan. This is done to screen for osteoporosis. You may have this scan if you are at high risk for osteoporosis. Discuss your test results, treatment options,   and if necessary, the need for more tests with your health care provider. Vaccines Your health care provider may recommend certain vaccines, such as:  Influenza vaccine. This is recommended every year.  Tetanus, diphtheria, and acellular pertussis (Tdap, Td) vaccine. You may need a Td booster every 10 years.  Varicella  vaccine. You may need this if you have not been vaccinated.  Zoster vaccine. You may need this after age 71.  Measles, mumps, and rubella (MMR) vaccine. You may need at least one dose of MMR if you were born in 1957 or later. You may also need a second dose.  Pneumococcal 13-valent conjugate (PCV13) vaccine. You may need this if you have certain conditions and were not previously vaccinated.  Pneumococcal polysaccharide (PPSV23) vaccine. You may need one or two doses if you smoke cigarettes or if you have certain conditions.  Meningococcal vaccine. You may need this if you have certain conditions.  Hepatitis A vaccine. You may need this if you have certain conditions or if you travel or work in places where you may be exposed to hepatitis A.  Hepatitis B vaccine. You may need this if you have certain conditions or if you travel or work in places where you may be exposed to hepatitis B.  Haemophilus influenzae type b (Hib) vaccine. You may need this if you have certain conditions. Talk to your health care provider about which screenings and vaccines you need and how often you need them. This information is not intended to replace advice given to you by your health care provider. Make sure you discuss any questions you have with your health care provider. Document Released: 04/02/2015 Document Revised: 04/26/2017 Document Reviewed: 01/05/2015 Elsevier Interactive Patient Education  2019 [place], Adult Impetigo is an infection of the skin. It commonly occurs in young children, but it can also occur in adults. The infection causes itchy blisters and sores that produce brownish-yellow fluid. As the fluid dries, it forms a thick, honey-colored crust. These skin changes usually occur on the face, but they can also affect other areas of the body. Impetigo usually goes away in 7-10 days with treatment. What are the causes? This condition is caused by two types of bacteria. It may be  caused by staphylococci or streptococci bacteria. These bacteria cause impetigo when they get under the surface of the skin. This often happens after some damage to the skin, such as:  Cuts, scrapes, or scratches.  Rashes.  Insect bites, especially when you scratch the area of a bite.  Chickenpox or other illnesses that cause open skin sores.  Nail biting or chewing. Impetigo can spread easily from one person to another (is contagious). It may be spread through close skin contact or by sharing towels, clothing, or other items that an infected person has touched. What increases the risk? The following factors may make you more likely to develop this condition:  Playing sports that include skin-to-skin contact with others.  Having a skin condition with open sores, such as chickenpox.  Having diabetes.  Having a weak body defense system (immune system).  Having many skin cuts or scrapes.  Living in an area that has high humidity levels.  Having poor hygiene.  Having high levels of staphylococci in your nose. What are the signs or symptoms? The main symptom of this condition is small blisters, often on the face around the mouth and nose. In time, the blisters break open and turn into tiny sores (lesions) with a yellow  crust. In some cases, the blisters cause itching or burning. With scratching, irritation, or lack of treatment, these small lesions may get larger. Other possible symptoms include:  Larger blisters.  Pus.  Swollen lymph glands. Scratching the affected area can cause impetigo to spread to other parts of the body. The bacteria can get under the fingernails and spread when you touch another area of your skin. How is this diagnosed? This condition is usually diagnosed during a physical exam. A skin sample or a sample of fluid from a blister may be taken for lab tests that involve growing bacteria (culture test). Lab tests can help to confirm the diagnosis or help to  determine the best treatment. How is this treated? Treatment for this condition depends on the severity of the condition:  Mild impetigo can be treated with prescription antibiotic cream.  Oral antibiotic medicine may be used in more severe cases.  Medicines that reduce itchiness (antihistamines)may also be used. Follow these instructions at home: Medicines  Take over-the-counter and prescription medicines only as told by your health care provider.  Apply or take your antibiotic as told by your health care provider. Do not stop using the antibiotic even if your condition improves. General instructions   To help prevent impetigo from spreading to other body areas: ? Keep your fingernails short and clean. ? Do not scratch the blisters or sores. ? Cover infected areas, if necessary, to keep from scratching. ? Wash your hands often with soap and warm water.  Before applying antibiotic cream or ointment, you should: ? Gently wash the infected areas with antibacterial soap and warm water. ? Soak crusted areas in warm, soapy water using antibacterial soap. ? Gently rub the areas to remove crusts. Do not scrub.  Do not share towels.  Wash your clothing and bedsheets in warm water that is 140F (60C) or warmer.  Stay home until you have used an antibiotic cream for 48 hours (2 days) or an oral antibiotic medicine for 24 hours (1 day). You should only return to work and activities with other people if your skin shows significant improvement. ? You may return to contact sports after you have used antibiotic medicine for 72 hours (3 days).  Keep all follow-up visits as told by your health care provider. This is important. How is this prevented?  Wash your hands often with soap and warm water.  Do not share towels, washcloths, clothing, bedding, or razors.  Keep your fingernails short.  Keep any cuts, scrapes, bug bites, or rashes clean and covered.  Use insect repellent to prevent  bug bites. Contact a health care provider if:  You develop more blisters or sores even with treatment.  Other family members get sores.  Your skin sores are not improving after 72 hours (3 days) of treatment.  You have a fever. Get help right away if:  You see spreading redness or swelling of the skin around your sores.  You see red streaks coming from your sores.  You develop a sore throat.  The area around your rash becomes warm, red, or tender to the touch.  You have dark, reddish-brown urine.  You do not urinate often or you urinate small amounts.  You are very tired (lethargic).  You have swelling in the face, hands, or feet. Summary  Impetigo is a skin infection that causes itchy blisters and sores that produce brownish-yellow fluid. As the fluid dries, it forms a crust.  This condition is caused by staphylococci or  streptococci bacteria. These bacteria cause impetigo when they get under the surface of the skin, such as through cuts, rashes, bug bites, or open sores.  Treatment for this condition may include antibiotic ointment or oral antibiotics.  To help prevent impetigo from spreading to other body areas, make sure you keep your fingernails short, avoid scratching, cover any blisters, and wash your hands often.  If you have impetigo, stay home until you have used an antibiotic cream for 48 hours (2 days) or an oral antibiotic medicine for 24 hours (1 day). You should only return to work and activities with other people if your skin shows significant improvement. This information is not intended to replace advice given to you by your health care provider. Make sure you discuss any questions you have with your health care provider. Document Released: 03/27/2014 Document Revised: 03/28/2016 Document Reviewed: 03/28/2016 Elsevier Interactive Patient Education  2019 [employer].

## 2018-05-28 ENCOUNTER — Encounter: Payer: Self-pay | Admitting: Gastroenterology

## 2018-05-28 ENCOUNTER — Ambulatory Visit: Payer: 59 | Admitting: Gastroenterology

## 2018-05-28 VITALS — BP 90/60 | HR 116 | Ht 65.0 in | Wt 138.1 lb

## 2018-05-28 DIAGNOSIS — K625 Hemorrhage of anus and rectum: Secondary | ICD-10-CM

## 2018-05-28 DIAGNOSIS — K648 Other hemorrhoids: Secondary | ICD-10-CM

## 2018-05-28 MED ORDER — SUPREP BOWEL PREP KIT 17.5-3.13-1.6 GM/177ML PO SOLN: ORAL | 0 refills | Status: DC

## 2018-05-28 NOTE — Progress Notes (Signed)
HPI :  48 y/o female ADD, anxiety, hemorrhoids, referred here by Abbe Amsterdam MD for rectal bleeding, hemorrhoids.  The patient reports she developed rectal bleeding in the summer of 2019. Initially was intermittent, mostly occured on the toilet paper,redblood, but over time has progressed to symptoms with most of her bowel movements. She reports red blood on the toilet paper and in the toilet bowl with roughly 90% of her stools. She denies any perianal pain or discomfort when she sees the blood. She has roughly one bowel movement per day. She denies any routine abdominal pains that bother her. She denies any straining or passing hard stools or being constipated. She eats well, no upper abdominal pain or nausea or vomiting. Her grandfather had colon cancer. She is not aware of any other cancer in the family history of polyps. She's never had a prior colonoscopy. She was given a trial of Anusol suppositories for suspected hemorrhoids which has helped slightly but has not resolved her symptoms. She denies any perianal irritation / edema. She does have some prolapse which spontaneously reduces.    Past Medical History:  Diagnosis Date  . ADD (attention deficit disorder)   . Anxiety   . GERD (gastroesophageal reflux disease)   . Wears contact lenses      Past Surgical History:  Procedure Laterality Date  . CESAREAN SECTION    . SHOULDER ACROMIOPLASTY Left 06/24/2014   Procedure: SHOULDER ACROMIOPLASTY;  Surgeon: Frederico Hamman, MD;  Location: [facility];  Service: Orthopedics;  Laterality: Left;  . SHOULDER ARTHROSCOPY Left 06/24/2014   Procedure: ARTHROSCOPY LEFT SHOULDER WITH MANIPULATION AND LYSIS OF ADHESIONS;  Surgeon: Frederico Hamman, MD;  Location: [facility];  Service: Orthopedics;  Laterality: Left;  . WISDOM TOOTH EXTRACTION     Family History  Problem Relation Age of Onset  . Hyperlipidemia Mother   . Diabetes Father   . Breast cancer Maternal  Grandmother   . Diabetes Maternal Grandfather   . Stroke Maternal Grandfather   . Parkinson's disease Paternal Grandmother   . Colon cancer Paternal Grandfather   . Heart disease Paternal Grandfather    Social History   Tobacco Use  . Smoking status: Former Smoker    Last attempt to quit: 06/19/1998    Years since quitting: 19.9  . Smokeless tobacco: Never Used  . Tobacco comment: quit 11  years ago  Substance Use Topics  . Alcohol use: Yes    Comment: occ  . Drug use: No   Current Outpatient Medications  Medication Sig Dispense Refill  . ALPRAZolam (XANAX PO) Take 2.5 mg by mouth as needed.    . Amphetamine-Dextroamphetamine (MYDAYIS PO) Take 30 mg by mouth daily.    . DULoxetine (CYMBALTA) 60 MG capsule Take 60 mg by mouth daily.    Marland Kitchen ibuprofen (ADVIL,MOTRIN) 200 MG tablet Take 400-600 mg by mouth every 6 (six) hours as needed for headache, mild pain or moderate pain.    . mupirocin ointment (BACTROBAN) 2 % Place 1 application into the nose 2 (two) times daily. 22 g 0   No current facility-administered medications for this visit.    No Known Allergies   Review of Systems: All systems reviewed and negative except where noted in HPI.   Lab Results  Component Value Date   WBC 7.5 05/15/2018   HGB 13.6 05/15/2018   HCT 38.6 05/15/2018   MCV 89.9 05/15/2018   PLT 193.0 05/15/2018    Lab Results  Component Value  Date   CREATININE 0.87 05/15/2018   BUN 13 05/15/2018   NA 140 05/15/2018   K 4.6 05/15/2018   CL 101 05/15/2018   CO2 32 05/15/2018    Lab Results  Component Value Date   ALT 17 10/09/2006   AST 16 10/09/2006   ALKPHOS 28 (L) 10/09/2006   BILITOT 0.8 10/09/2006     Physical Exam: BP 90/60 (BP Location: Left Arm, Patient Position: Sitting, Cuff Size: Normal)   Pulse (!) 116   Ht 5\' 5"  (1.651 m) Comment: height measured without shoes  Wt 138 lb 2 oz (62.7 kg)   BMI 22.99 kg/m  Constitutional: Pleasant,well-developed, female in no acute  distress. HEENT: Normocephalic and atraumatic. Conjunctivae are normal. No scleral icterus. Neck supple.  Cardiovascular: Normal rate, regular rhythm.  Pulmonary/chest: Effort normal and breath sounds normal. No wheezing, rales or rhonchi. Abdominal: Soft, nondistended, nontender. There are no masses palpable. No hepatomegaly. DRE / Anoscopy - no fissure, no mass lesion, internal hemorrhoids, inflamed in RP position Extremities: no edema Lymphadenopathy: No cervical adenopathy noted. Neurological: Alert and oriented to person place and time. Skin: Skin is warm and dry. No rashes noted. Psychiatric: Normal mood and affect. Behavior is normal.   ASSESSMENT AND PLAN: 48 year old female here for new patient consultation regarding following:  Rectal bleeding / grade II internal hemorrhoids - ongoing intermittent rectal bleeding which has progressed to more routine symptoms over the past several months. Anoscopy shows some inflamed hemorrhoids as outlined above. While hemorrhoids are the most likely diagnosis to account for her symptoms, given her age a colonoscopy is recommended to ensure no bleeding polyp or mass lesion which could present like this. I discussed colonoscopy and anesthesia with her including risks and benefits of each. Following this discussion she wanted to proceed with the exam. If this exam shows no other pathology to account for her symptoms, I think she will be a good candidate for hemorrhoid banding to treat this more definitively and avoid surgery. In the interim recommend she use a daily fiber supplement to treat the hemorrhoids. She can also use the Anusol as needed if that helps. Further recommendations pending her course and colonoscopy result.  Ileene Patrick, MD [facility]  CC: Deeann Saint, MD

## 2018-05-28 NOTE — Patient Instructions (Signed)
If you are age 48 or older, your body mass index should be between 23-30. Your Body mass index is 22.99 kg/m. If this is out of the aforementioned range listed, please consider follow up with your Primary Care Provider.  If you are age 10 or younger, your body mass index should be between 19-25. Your Body mass index is 22.99 kg/m. If this is out of the aformentioned range listed, please consider follow up with your Primary Care Provider.   You have been scheduled for a colonoscopy. Please follow written instructions given to you at your visit today.  Please pick up your prep supplies at the pharmacy within the next 1-3 days. If you use inhalers (even only as needed), please bring them with you on the day of your procedure. Your physician has requested that you go to www.startemmi.com and enter the access code given to you at your visit today. This web site gives a general overview about your procedure. However, you should still follow specific instructions given to you by our office regarding your preparation for the procedure.  Take a daily fiber supplement.   Thank you for entrusting me with your care and for choosing [facility], Dr. Ileene Patrick

## 2018-06-12 ENCOUNTER — Telehealth: Payer: Self-pay | Admitting: *Deleted

## 2018-06-12 NOTE — Telephone Encounter (Signed)
Covid-19 travel screening questions  Have you traveled in the last 14 days? If yes where?  Do you now or have you had a fever in the last 14 days?  Do you have any respiratory symptoms of shortness of breath or cough now or in the last 14 days?  Do you have a medical history of Congestive Heart Failure?  Do you have a medical history of lung disease?  Do you have any family members or close contacts with diagnosed or suspected Covid-19?       

## 2018-06-12 NOTE — Telephone Encounter (Signed)
Pt canceled procedure for 06/13/18 Sm

## 2018-06-12 NOTE — Telephone Encounter (Signed)
Left second VM to complete the COVID 19 screening and to move her appointment to 9:00 am. SM

## 2018-06-12 NOTE — Telephone Encounter (Signed)
Covid-19 travel screening questions  Have you traveled in the last 14 days? If yes where?  Do you now or have you had a fever in the last 14 days?  Do you have any respiratory symptoms of shortness of breath or cough now or in the last 14 days?  Do you have a medical history of Congestive Heart Failure?  Do you have a medical history of lung disease?  Do you have any family members or close contacts with diagnosed or suspected Covid-19?      Left message for patient to call back regarding moving her procedure to 9:00 am form 3:30 and to do her COVID-19 screening. Will await her call back. SM

## 2018-06-13 ENCOUNTER — Encounter: Payer: 59 | Admitting: Gastroenterology

## 2018-07-05 DIAGNOSIS — S5001XA Contusion of right elbow, initial encounter: Secondary | ICD-10-CM | POA: Diagnosis not present

## 2018-10-14 ENCOUNTER — Other Ambulatory Visit: Payer: Self-pay

## 2018-10-14 DIAGNOSIS — Z20822 Contact with and (suspected) exposure to covid-19: Secondary | ICD-10-CM

## 2018-10-16 LAB — NOVEL CORONAVIRUS, NAA: SARS-CoV-2, NAA: NOT DETECTED

## 2019-05-11 ENCOUNTER — Emergency Department (HOSPITAL_BASED_OUTPATIENT_CLINIC_OR_DEPARTMENT_OTHER)
Admission: EM | Admit: 2019-05-11 | Discharge: 2019-05-11 | Disposition: A | Discharge status: Home / Self Care | Payer: 59 | Attending: Emergency Medicine | Admitting: Emergency Medicine

## 2019-05-11 ENCOUNTER — Other Ambulatory Visit: Payer: Self-pay

## 2019-05-11 ENCOUNTER — Encounter (HOSPITAL_BASED_OUTPATIENT_CLINIC_OR_DEPARTMENT_OTHER): Payer: Self-pay | Admitting: *Deleted

## 2019-05-11 ENCOUNTER — Emergency Department (HOSPITAL_BASED_OUTPATIENT_CLINIC_OR_DEPARTMENT_OTHER): Payer: 59

## 2019-05-11 DIAGNOSIS — Y999 Unspecified external cause status: Secondary | ICD-10-CM | POA: Diagnosis not present

## 2019-05-11 DIAGNOSIS — S60371A Other superficial bite of right thumb, initial encounter: Secondary | ICD-10-CM | POA: Insufficient documentation

## 2019-05-11 DIAGNOSIS — W540XXA Bitten by dog, initial encounter: Secondary | ICD-10-CM | POA: Diagnosis not present

## 2019-05-11 DIAGNOSIS — F17210 Nicotine dependence, cigarettes, uncomplicated: Secondary | ICD-10-CM | POA: Insufficient documentation

## 2019-05-11 DIAGNOSIS — Y939 Activity, unspecified: Secondary | ICD-10-CM | POA: Insufficient documentation

## 2019-05-11 DIAGNOSIS — Y929 Unspecified place or not applicable: Secondary | ICD-10-CM | POA: Insufficient documentation

## 2019-05-11 DIAGNOSIS — Z79899 Other long term (current) drug therapy: Secondary | ICD-10-CM | POA: Diagnosis not present

## 2019-05-11 MED ORDER — AMOXICILLIN-POT CLAVULANATE 875-125 MG PO TABS: 1.0000 | ORAL_TABLET | Freq: Two times a day (BID) | ORAL | 0 refills | Status: AC

## 2019-05-11 MED ORDER — AMOXICILLIN-POT CLAVULANATE 875-125 MG PO TABS
1.0000 | ORAL_TABLET | Freq: Once | ORAL | Status: AC
Start: 2019-05-11 — End: 2019-05-11
  Administered 2019-05-11: 1 via ORAL
  Filled 2019-05-11: qty 1

## 2019-05-11 MED ORDER — TETANUS-DIPHTH-ACELL PERTUSSIS 5-2.5-18.5 LF-MCG/0.5 IM SUSP: 0.5000 mL | Freq: Once | INTRAMUSCULAR | Status: DC

## 2019-05-11 NOTE — ED Triage Notes (Signed)
Pt bitten by dog today that was injured and found in road. Owners state dog is UTD on vaccines. Puncture wounds noted to base on right thumb. Pt reports increased pain and swelling

## 2019-05-11 NOTE — Discharge Instructions (Addendum)
Keep wound clean and dry as discussed continue washing with soap and water, can use over the counter antibiotic ointment at the site of puncture wounds as well, take antibiotic as prescribed, closely follow-up with primary care doctor, follow-up with hand doctor if needed as well.

## 2019-05-11 NOTE — ED Provider Notes (Addendum)
[facility] EMERGENCY DEPARTMENT Provider Note   CSN: [id number] Arrival date & time: 05/11/19  2213     History Chief Complaint  Patient presents with  . Animal Bite    Kristy [name] is a 49 y.o. female.   Animal Bite Contact animal:  Dog Location:  Hand Hand injury location:  R fingers (right thumb) Pain details:    Quality:  Aching   Severity:  Mild   Timing:  Intermittent   Progression:  Waxing and waning Animal's rabies vaccination status:  Up to date Tetanus status:  Up to date Relieved by:  Nothing Worsened by:  Activity Associated symptoms: swelling   Associated symptoms: no fever, no numbness and no rash        Past Medical History:  Diagnosis Date  . ADD (attention deficit disorder)   . Anxiety   . GERD (gastroesophageal reflux disease)   . Wears contact lenses     Patient Active Problem List   Diagnosis Date Noted  . Depression with anxiety 05/08/2011  . GERD 10/09/2006  . SYMPTOM, EDEMA 10/09/2006    Past Surgical History:  Procedure Laterality Date  . CESAREAN SECTION    . SHOULDER ACROMIOPLASTY Left 06/24/2014   Procedure: SHOULDER ACROMIOPLASTY;  Surgeon: Earlie Server, MD;  Location: [place] [place];  Service: Orthopedics;  Laterality: Left;  . SHOULDER ARTHROSCOPY Left 06/24/2014   Procedure: ARTHROSCOPY LEFT SHOULDER WITH MANIPULATION AND LYSIS OF ADHESIONS;  Surgeon: Earlie Server, MD;  Location: [place];  Service: Orthopedics;  Laterality: Left;  . WISDOM TOOTH EXTRACTION       OB History   No obstetric history on file.     Family History  Problem Relation Age of Onset  . Hyperlipidemia Mother   . Diabetes Father   . Breast cancer Maternal Grandmother   . Diabetes Maternal Grandfather   . Stroke Maternal Grandfather   . Parkinson's disease Paternal Grandmother   . Colon cancer Paternal Grandfather   . Heart disease Paternal Grandfather     Social History   Tobacco Use  . Smoking  status: Current Every Day Smoker    Last attempt to quit: 06/19/1998    Years since quitting: 20.9  . Smokeless tobacco: Never Used  . Tobacco comment: quit 11  years ago  Substance Use Topics  . Alcohol use: Yes    Comment: occ  . Drug use: No    Home Medications Prior to Admission medications   Medication Sig Start Date End Date Taking? Authorizing Provider  ALPRAZolam (XANAX PO) Take 2.5 mg by mouth as needed.    [provider]  amoxicillin-clavulanate (AUGMENTIN) 875-125 MG tablet Take 1 tablet by mouth every 12 (twelve) hours for 10 days. 05/11/19 05/21/19  Yennifer Segovia, DO  Amphetamine-Dextroamphetamine (MYDAYIS PO) Take 30 mg by mouth daily.    [provider]  DULoxetine (CYMBALTA) 60 MG capsule Take 60 mg by mouth daily. 10/24/15   [provider]  ibuprofen (ADVIL,MOTRIN) 200 MG tablet Take 400-600 mg by mouth every 6 (six) hours as needed for headache, mild pain or moderate pain.    [provider]  mupirocin ointment (BACTROBAN) 2 % Place 1 application into the nose 2 (two) times daily. 05/15/18   Billie Ruddy, MD  SUPREP BOWEL PREP KIT 17.5-3.13-1.6 GM/177ML SOLN Suprep-Use as directed 05/28/18   Armbruster, Carlota Raspberry, MD    Allergies    Patient has no known allergies.  Review of Systems  Review of Systems  Constitutional: Negative for fatigue and fever.  Skin: Positive for color change and wound. Negative for pallor and rash.  Neurological: Negative for weakness and numbness.    Physical Exam Updated Vital Signs BP 109/67 (BP Location: Left Arm)   Pulse 93   Temp 98.2 F (36.8 C) (Oral)   Resp 16   Ht '5\' 5"'  (1.651 m)   Wt 64 kg   SpO2 100%   BMI 23.46 kg/m   Physical Exam Vitals and nursing note reviewed.  Constitutional:      General: She is not in acute distress.    Appearance: She is well-developed.  HENT:     Head: Normocephalic and atraumatic.  Eyes:     Conjunctiva/sclera: Conjunctivae normal.    Cardiovascular:     Pulses: Normal pulses.     Heart sounds: No murmur.  Musculoskeletal:        General: Swelling and tenderness present. Normal range of motion.     Cervical back: Neck supple.     Comments: Normal range of motion of right thumb, swelling to the base of the right thumb with tenderness  Skin:    General: Skin is warm and dry.     Comments: Puncture wound to the base of the right thumb with multiple abrasions to the right thumb  Neurological:     General: No focal deficit present.     Mental Status: She is alert.     Sensory: No sensory deficit.     Motor: No weakness.     ED Results / Procedures / Treatments   Labs (all labs ordered are listed, but only abnormal results are displayed) Labs Reviewed - No data to display  EKG None  Radiology DG Hand Complete Right  Result Date: 05/11/2019 CLINICAL DATA:  Dog bite, puncture wounds to the base of the right thumb EXAM: RIGHT HAND - COMPLETE 3+ VIEW COMPARISON:  None. FINDINGS: No soft tissue gas or foreign body is seen. No associated osseous injury or defect. There is no evidence of fracture or dislocation. There is no evidence of arthropathy or other focal bone abnormality. Soft tissues are unremarkable. IMPRESSION: Site of reported dog bite injury is poorly visualized radiographically. No soft tissue gas, foreign body or acute osseous injury is seen. Electronically Signed   By: Lovena Le M.D.   On: 05/11/2019 23:10    Procedures Procedures (including critical care time)  Medications Ordered in ED Medications  Tdap (BOOSTRIX) injection 0.5 mL (has no administration in time range)  amoxicillin-clavulanate (AUGMENTIN) 875-125 MG per tablet 1 tablet (1 tablet Oral Given 05/11/19 2255)    ED Course  I have reviewed the triage vital signs and the nursing notes.  Pertinent labs & imaging results that were available during my care of the patient were reviewed by me and considered in my medical decision making  (see chart for details).    MDM Rules/Calculators/A&P                      Kristy [name] is a 49 year old female with no significant medical history who presents to the emergency department with dog bite.  Patient bit by dog earlier today at the base of the right thumb.  Has 1 puncture wound at the base of the right thumb and other superficial abrasions.  Good strength and sensation throughout the right upper extremity especially in the right hand.  Patient has thoroughly cleaned out this wound at home.  Dog is known to have updated vaccinations.  X-ray showed no acute fracture of the right hand.  Patient was given Augmentin dose while in the emergency department and given Augmentin prescription.  Given wound care instructions and recommend close follow-up with primary care doctor.  Given number for hand surgery if needed as well.  However no concern for ligamentous or tendon injury or flexor tenosynovitis at this time.  However, she feels confident that she can follow-up with her primary care physician.  She understands return precautions.  Tetanus shot up to date per patient.  This chart was dictated using voice recognition software.  Despite best efforts to proofread,  errors can occur which can change the documentation meaning.   Final Clinical Impression(s) / ED Diagnoses Final diagnoses:  Dog bite, initial encounter    Rx / DC Orders ED Discharge Orders         Ordered    amoxicillin-clavulanate (AUGMENTIN) 875-125 MG tablet  Every 12 hours     05/11/19 2314           Lennice Sites, DO 05/11/19 [place], [place], DO 05/11/19 2319

## 2019-07-28 ENCOUNTER — Emergency Department (HOSPITAL_COMMUNITY): Admission: EM | Admit: 2019-07-28 | Discharge: 2019-07-28 | Discharge status: Against Medical Advice | Payer: 59

## 2019-07-28 NOTE — ED Notes (Signed)
Pt called 3X for room call. No answer. Eloped from waiting room.

## 2019-11-15 ENCOUNTER — Ambulatory Visit
Admission: EM | Admit: 2019-11-15 | Discharge: 2019-11-15 | Disposition: A | Discharge status: Home / Self Care | Payer: 59 | Attending: Emergency Medicine | Admitting: Emergency Medicine

## 2019-11-15 ENCOUNTER — Encounter: Payer: Self-pay | Admitting: Emergency Medicine

## 2019-11-15 DIAGNOSIS — N390 Urinary tract infection, site not specified: Secondary | ICD-10-CM | POA: Insufficient documentation

## 2019-11-15 DIAGNOSIS — R109 Unspecified abdominal pain: Secondary | ICD-10-CM | POA: Insufficient documentation

## 2019-11-15 LAB — POCT URINALYSIS DIP (MANUAL ENTRY)
Blood, UA: NEGATIVE
Glucose, UA: 100 mg/dL — AB
Nitrite, UA: POSITIVE — AB
Protein Ur, POC: 100 mg/dL — AB
Spec Grav, UA: 1.02 (ref 1.010–1.025)
Urobilinogen, UA: 4 E.U./dL — AB
pH, UA: 7 (ref 5.0–8.0)

## 2019-11-15 MED ORDER — NITROFURANTOIN MONOHYD MACRO 100 MG PO CAPS: 100.0000 mg | ORAL_CAPSULE | Freq: Two times a day (BID) | ORAL | 0 refills | Status: DC

## 2019-11-15 NOTE — Discharge Instructions (Addendum)
Urine culture sent.  We will call you with the results.   Push fluids and get plenty of rest.   Take antibiotic as directed and to completion Continue to take Azo for symptomatic relief Follow up with PCP if symptoms persists Return here or go to ER if you have any new or worsening symptoms such as fever, worsening abdominal pain, nausea/vomiting, flank pain, etc..Marland Kitchen

## 2019-11-15 NOTE — ED Triage Notes (Signed)
took an at home uti test said it was positive for uti. only symptom is her kidneys hurt and she has dark urine symptoms for almost 1 week. pt states she does not drink a lot feels dehydrated.

## 2019-11-15 NOTE — ED Provider Notes (Signed)
[facility]   Chief Complaint  Patient presents with  . Urinary Tract Infection     SUBJECTIVE:  Kristy [name] is a 49 y.o. female presented to the urgent care for complaint of flank pain and dark urine for the past 1 week.  Patient denies a precipitating event, recent sexual encounter, excessive caffeine intake.  Localizes the pain to the  flank.  Pain is intermittentand describes it achy.  Has tried OTC Azo without relief.  Symptoms are made worse with urination.  Admits to similar symptoms in the past.  Denies fever, chills, nausea, vomiting, abdominal pain,  abnormal vaginal discharge or bleeding, hematuria.    LMP: No LMP recorded. Patient has had an ablation.  ROS: As in HPI.  All other pertinent ROS negative.     Past Medical History:  Diagnosis Date  . ADD (attention deficit disorder)   . Anxiety   . GERD (gastroesophageal reflux disease)   . Wears contact lenses    Past Surgical History:  Procedure Laterality Date  . CESAREAN SECTION    . SHOULDER ACROMIOPLASTY Left 06/24/2014   Procedure: SHOULDER ACROMIOPLASTY;  Surgeon: Earlie Server, MD;  Location: [place];  Service: Orthopedics;  Laterality: Left;  . SHOULDER ARTHROSCOPY Left 06/24/2014   Procedure: ARTHROSCOPY LEFT SHOULDER WITH MANIPULATION AND LYSIS OF ADHESIONS;  Surgeon: Earlie Server, MD;  Location: [place];  Service: Orthopedics;  Laterality: Left;  . WISDOM TOOTH EXTRACTION     No Known Allergies No current facility-administered medications on file prior to encounter.   Current Outpatient Medications on File Prior to Encounter  Medication Sig Dispense Refill  . ALPRAZolam (XANAX PO) Take 2.5 mg by mouth as needed.    . Amphetamine-Dextroamphetamine (MYDAYIS PO) Take 30 mg by mouth daily.    . DULoxetine (CYMBALTA) 60 MG capsule Take 60 mg by mouth daily.    Marland Kitchen ibuprofen (ADVIL,MOTRIN) 200 MG tablet Take 400-600 mg by mouth every 6 (six) hours as needed for  headache, mild pain or moderate pain.    . mupirocin ointment (BACTROBAN) 2 % Place 1 application into the nose 2 (two) times daily. 22 g 0  . SUPREP BOWEL PREP KIT 17.5-3.13-1.6 GM/177ML SOLN Suprep-Use as directed 354 mL 0   Social History   Socioeconomic History  . Marital status: Divorced    Spouse name: Not on file  . Number of children: 2  . Years of education: Not on file  . Highest education level: Not on file  Occupational History  . Occupation: Curator  Tobacco Use  . Smoking status: Current Every Day Smoker    Last attempt to quit: 06/19/1998    Years since quitting: 21.4  . Smokeless tobacco: Never Used  . Tobacco comment: quit 11  years ago  Vaping Use  . Vaping Use: Never used  Substance and Sexual Activity  . Alcohol use: Yes    Comment: occ  . Drug use: No  . Sexual activity: Not on file  Other Topics Concern  . Not on file  Social History Narrative  . Not on file   Social Determinants of Health   Financial Resource Strain:   . Difficulty of Paying Living Expenses: Not on file  Food Insecurity:   . Worried About Charity fundraiser in the Last Year: Not on file  . Ran Out of Food in the Last Year: Not on file  Transportation Needs:   . Lack of Transportation (Medical): Not on file  .  Lack of Transportation (Non-Medical): Not on file  Physical Activity:   . Days of Exercise per Week: Not on file  . Minutes of Exercise per Session: Not on file  Stress:   . Feeling of Stress : Not on file  Social Connections:   . Frequency of Communication with Friends and Family: Not on file  . Frequency of Social Gatherings with Friends and Family: Not on file  . Attends Religious Services: Not on file  . Active Member of Clubs or Organizations: Not on file  . Attends Archivist Meetings: Not on file  . Marital Status: Not on file  Intimate Partner Violence:   . Fear of Current or Ex-Partner: Not on file  . Emotionally Abused: Not on file  . Physically  Abused: Not on file  . Sexually Abused: Not on file   Family History  Problem Relation Age of Onset  . Hyperlipidemia Mother   . Diabetes Father   . Breast cancer Maternal Grandmother   . Diabetes Maternal Grandfather   . Stroke Maternal Grandfather   . Parkinson's disease Paternal Grandmother   . Colon cancer Paternal Grandfather   . Heart disease Paternal Grandfather     OBJECTIVE:  Vitals:   11/15/19 1241  BP: 110/71  Pulse: 97  Resp: 16  Temp: 97.9 F (36.6 C)  TempSrc: Oral  SpO2: 98%   General appearance: AOx3 in no acute distress HEENT: NCAT.  Oropharynx clear.  Lungs: clear to auscultation bilaterally without adventitious breath sounds Heart: regular rate and rhythm.  Radial pulses 2+ symmetrical bilaterally Abdomen: soft; non-distended; no tenderness; bowel sounds present; no guarding or rebound tenderness Back: no CVA tenderness Extremities: no edema; symmetrical with no gross deformities Skin: warm and dry Neurologic: Ambulates from chair to exam table without difficulty Psychological: alert and cooperative; normal mood and affect  Labs Reviewed  POCT URINALYSIS DIP (MANUAL ENTRY) - Abnormal; Notable for the following components:      Result Value   Color, UA orange (*)    Glucose, UA =100 (*)    Bilirubin, UA small (*)    Ketones, POC UA small (15) (*)    Protein Ur, POC =100 (*)    Urobilinogen, UA 4.0 (*)    Nitrite, UA Positive (*)    Leukocytes, UA Large (3+) (*)    All other components within normal limits  URINE CULTURE    ASSESSMENT & PLAN:  1. Flank pain   2. Acute lower UTI     Meds ordered this encounter  Medications  . nitrofurantoin, macrocrystal-monohydrate, (MACROBID) 100 MG capsule    Sig: Take 1 capsule (100 mg total) by mouth 2 (two) times daily.    Dispense:  10 capsule    Refill:  0   Discharge instructions Urine culture sent.  We will call you with the results.   Push fluids and get plenty of rest.   Take antibiotic  as directed and to completion Continue to take Azo for symptomatic relief Follow up with PCP if symptoms persists Return here or go to ER if you have any new or worsening symptoms such as fever, worsening abdominal pain, nausea/vomiting, flank pain, etc...  Outlined signs and symptoms indicating need for more acute intervention. Patient verbalized understanding. After Visit Summary given.     Note: This document was prepared using Dragon voice recognition software and may include unintentional dictation errors.    Emerson Monte, [place] 11/15/19 1304

## 2019-11-17 LAB — URINE CULTURE
Culture: 50000 — AB
Special Requests: NORMAL

## 2019-11-26 ENCOUNTER — Other Ambulatory Visit: Payer: Self-pay

## 2019-11-27 ENCOUNTER — Encounter: Payer: Self-pay | Admitting: Family Medicine

## 2020-11-19 ENCOUNTER — Ambulatory Visit: Payer: Self-pay | Admitting: Family Medicine

## 2020-12-08 ENCOUNTER — Ambulatory Visit (INDEPENDENT_AMBULATORY_CARE_PROVIDER_SITE_OTHER): Payer: 59 | Admitting: Family Medicine

## 2020-12-08 ENCOUNTER — Other Ambulatory Visit: Payer: Self-pay

## 2020-12-08 ENCOUNTER — Encounter: Payer: Self-pay | Admitting: Family Medicine

## 2020-12-08 VITALS — BP 120/72 | HR 82 | Temp 98.3°F | Wt 132.2 lb

## 2020-12-08 DIAGNOSIS — R5383 Other fatigue: Secondary | ICD-10-CM

## 2020-12-08 DIAGNOSIS — F321 Major depressive disorder, single episode, moderate: Secondary | ICD-10-CM | POA: Diagnosis not present

## 2020-12-08 NOTE — Progress Notes (Signed)
Subjective:    Patient ID: Kristy [name], female    DOB: March 01, 1971, 50 y.o.   MRN: [id number]  Chief Complaint  Patient presents with   Medication Consultation    Place and dr she was going to for add and depression is not on insurance and needs to know what to do or guidance for where to go. Still feels tired from covid on easter weekend    HPI Patient lost to f/u, last seen 05/15/18.  Seen today for f/u.  Pt with decreased energy and motivation x months.  Pt feels like symptoms started after she had COVID in April.  Pt notes recent labs with OB/Gyn were normal.  Pt has been out of work x 2 months.  Has new insurance that is not accepted at [facility].  Is on the wait list for Dr. Carie Caddy office, but no appts until March 2023.  Pt requesting refills on Cymbalta, states only has a wk or 2 of pills left.  Inquires about restarting adderall.  Past Medical History:  Diagnosis Date   ADD (attention deficit disorder)    Anxiety    GERD (gastroesophageal reflux disease)    Wears contact lenses     No Known Allergies  ROS General: Denies fever, chills, night sweats, changes in weight, changes in appetite +decreased energy and motivation HEENT: Denies headaches, ear pain, changes in vision, rhinorrhea, sore throat CV: Denies CP, palpitations, SOB, orthopnea Pulm: Denies SOB, cough, wheezing GI: Denies abdominal pain, nausea, vomiting, diarrhea, constipation GU: Denies dysuria, hematuria, frequency, vaginal discharge Msk: Denies muscle cramps, joint pains Neuro: Denies weakness, numbness, tingling Skin: Denies rashes, bruising Psych: Denies depression, anxiety, hallucinations  + anxiety, depression, ADHD    Objective:    Blood pressure 120/72, pulse 82, temperature 98.3 F (36.8 C), temperature source Oral, weight 132 lb 3.2 oz (60 kg), SpO2 95 %.  Gen. Pleasant, well-nourished, in no distress, normal affect   HEENT: [place]/AT, face symmetric, conjunctiva clear, no  scleral icterus, PERRLA, EOMI, nares patent without drainage Lungs: no accessory muscle use Cardiovascular: RRR, no peripheral edema Musculoskeletal: No deformities, no cyanosis or clubbing, normal tone Neuro:  A&Ox3, CN II-XII intact, normal gait Skin:  Warm, no lesions/ rash   Wt Readings from Last 3 Encounters:  12/08/20 132 lb 3.2 oz (60 kg)  05/11/19 141 lb (64 kg)  05/28/18 138 lb 2 oz (62.7 kg)    Lab Results  Component Value Date   WBC 7.5 05/15/2018   HGB 13.6 05/15/2018   HCT 38.6 05/15/2018   PLT 193.0 05/15/2018   GLUCOSE 97 05/15/2018   CHOL 172 05/15/2018   TRIG 64.0 05/15/2018   HDL 87.30 05/15/2018   LDLCALC 72 05/15/2018   ALT 17 10/09/2006   AST 16 10/09/2006   NA 140 05/15/2018   K 4.6 05/15/2018   CL 101 05/15/2018   CREATININE 0.87 05/15/2018   BUN 13 05/15/2018   CO2 32 05/15/2018   HGBA1C 4.6 05/15/2018   Depression screen PHQ 2/9 12/08/2020  Decreased Interest 2  Down, Depressed, Hopeless 2  PHQ - 2 Score 4  Altered sleeping 3  Tired, decreased energy 2  Change in appetite 1  Feeling bad or failure about yourself  0  Trouble concentrating 3  Moving slowly or fidgety/restless 2  Suicidal thoughts 0  PHQ-9 Score 15  Difficult doing work/chores Very difficult   GAD 7 : Generalized Anxiety Score 12/08/2020  Nervous, Anxious, on Edge 2  Control/stop worrying 0  Worry too much - different things 0  Trouble relaxing 1  Restless 2  Easily annoyed or irritable 1  Afraid - awful might happen 0  Total GAD 7 Score 6  Anxiety Difficulty Very difficult       Assessment/Plan:  Fatigue, unspecified type -discussed possible causes including long COVID, increased depression, CPAP -Discussed increasing exercise -Consider OTC supplements such as zinc, magnesium, vitamin D  Depression, major, single episode, moderate (HCC) -PHQ-9 score 15 -GAD-7 score 6 -Discussed finding a new BH provider as covered by insurance.  Patient advised to look on  insurance website or call in regards to this. -PMDP reviewed.  Last Rx for Mydayis 12/2019 with 30 tabs.  No recent Cymbalta Rxs noted. -Given information for area [facility] providers that may be able to see patient sooner. -consider starting SSRI. -Given precautions  F/u in 4- 6 wks.  Abbe Amsterdam, MD

## 2020-12-08 NOTE — Patient Instructions (Addendum)
Behavioral Health Services: -to make an appointment contact the office/provider you are interested in seeing.  No referral is needed.   Perhaps one of the places listed below can get you in sooner.  [facility]  -[address]  [phone] -Offer counseling and Psychiatry services.    Dr. Jannifer Franklin is a Psychiatrist with [facility]. [phone]

## 2021-06-15 ENCOUNTER — Encounter (HOSPITAL_BASED_OUTPATIENT_CLINIC_OR_DEPARTMENT_OTHER): Payer: Self-pay

## 2021-06-15 ENCOUNTER — Other Ambulatory Visit: Payer: Self-pay

## 2021-06-15 ENCOUNTER — Emergency Department (HOSPITAL_BASED_OUTPATIENT_CLINIC_OR_DEPARTMENT_OTHER): Payer: Commercial Managed Care - HMO

## 2021-06-15 ENCOUNTER — Emergency Department (HOSPITAL_BASED_OUTPATIENT_CLINIC_OR_DEPARTMENT_OTHER)
Admission: EM | Admit: 2021-06-15 | Discharge: 2021-06-15 | Disposition: A | Discharge status: Home / Self Care | Payer: Commercial Managed Care - HMO | Attending: Emergency Medicine | Admitting: Emergency Medicine

## 2021-06-15 DIAGNOSIS — R112 Nausea with vomiting, unspecified: Secondary | ICD-10-CM | POA: Insufficient documentation

## 2021-06-15 DIAGNOSIS — R1084 Generalized abdominal pain: Secondary | ICD-10-CM | POA: Insufficient documentation

## 2021-06-15 DIAGNOSIS — R197 Diarrhea, unspecified: Secondary | ICD-10-CM | POA: Insufficient documentation

## 2021-06-15 LAB — URINALYSIS, ROUTINE W REFLEX MICROSCOPIC
Bilirubin Urine: NEGATIVE
Glucose, UA: NEGATIVE mg/dL
Hgb urine dipstick: NEGATIVE
Ketones, ur: NEGATIVE mg/dL
Leukocytes,Ua: NEGATIVE
Nitrite: NEGATIVE
Specific Gravity, Urine: 1.022 (ref 1.005–1.030)
pH: 7.5 (ref 5.0–8.0)

## 2021-06-15 LAB — COMPREHENSIVE METABOLIC PANEL
ALT: 9 U/L (ref 0–44)
AST: 13 U/L — ABNORMAL LOW (ref 15–41)
Albumin: 4.5 g/dL (ref 3.5–5.0)
Alkaline Phosphatase: 70 U/L (ref 38–126)
Anion gap: 9 (ref 5–15)
BUN: 18 mg/dL (ref 6–20)
CO2: 27 mmol/L (ref 22–32)
Calcium: 9.5 mg/dL (ref 8.9–10.3)
Chloride: 102 mmol/L (ref 98–111)
Creatinine, Ser: 0.87 mg/dL (ref 0.44–1.00)
GFR, Estimated: >60 mL/min (ref >60)
Glucose, Bld: 117 mg/dL — ABNORMAL HIGH (ref 70–99)
Potassium: 3.8 mmol/L (ref 3.5–5.1)
Sodium: 138 mmol/L (ref 135–145)
Total Bilirubin: 0.8 mg/dL (ref 0.3–1.2)
Total Protein: 7.3 g/dL (ref 6.5–8.1)

## 2021-06-15 LAB — LIPASE, BLOOD: Lipase: 36 U/L (ref 11–51)

## 2021-06-15 LAB — CBC
HCT: 42.4 % (ref 36.0–46.0)
Hemoglobin: 14.4 g/dL (ref 12.0–15.0)
MCH: 30.1 pg (ref 26.0–34.0)
MCHC: 34 g/dL (ref 30.0–36.0)
MCV: 88.7 fL (ref 80.0–100.0)
Platelets: 255 10*3/uL (ref 150–400)
RBC: 4.78 MIL/uL (ref 3.87–5.11)
RDW: 12.4 % (ref 11.5–15.5)
WBC: 8 10*3/uL (ref 4.0–10.5)
nRBC: 0 % (ref 0.0–0.2)

## 2021-06-15 LAB — PREGNANCY, URINE: Preg Test, Ur: NEGATIVE

## 2021-06-15 IMAGING — CT CT ABD-PELV W/ CM
2 of 5 series · 16 of 46 positions shown, 18 images · IV contrast (APPLIED)
Comparison: CT abdomen pelvis dated [DATE].

CLINICAL DATA: Right lower quadrant abdominal pain.

EXAM:
CT ABDOMEN AND PELVIS WITH CONTRAST
TECHNIQUE: Multidetector CT imaging of the abdomen and pelvis was performed
using the standard protocol following bolus administration of
intravenous contrast.

[Series 2: abd pel w · axial · 0.61mm/px · z∈[+747,+1147]mm · 13 of 90 slices shown, 15 images]
[im 5/90  soft-tissue]
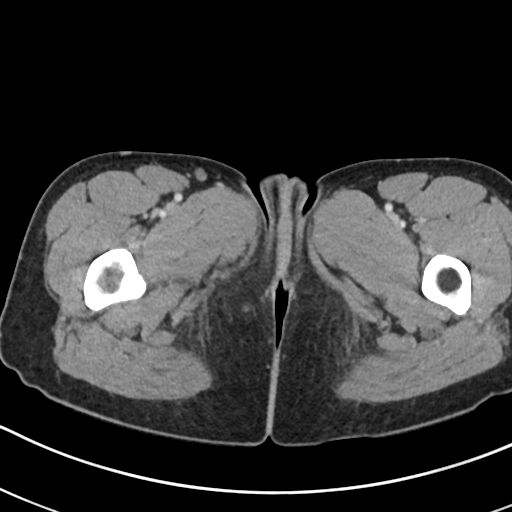
[im 5/90  bone]
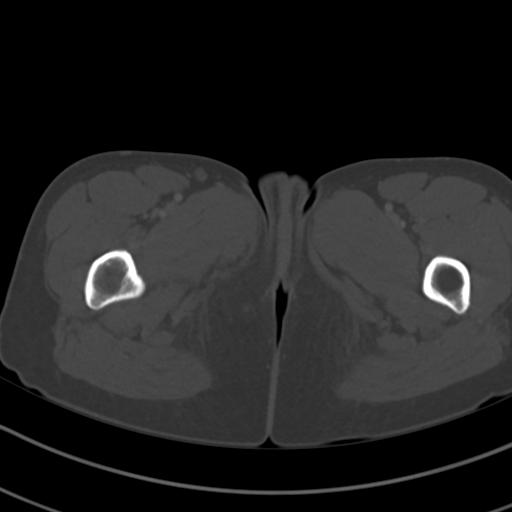
[im 15/90  soft-tissue]
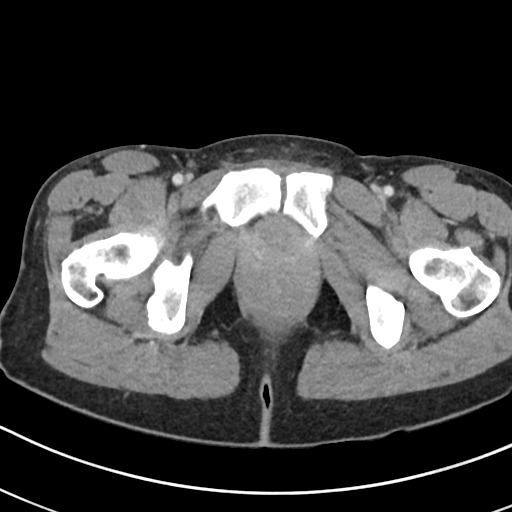
[im 19/90  soft-tissue]
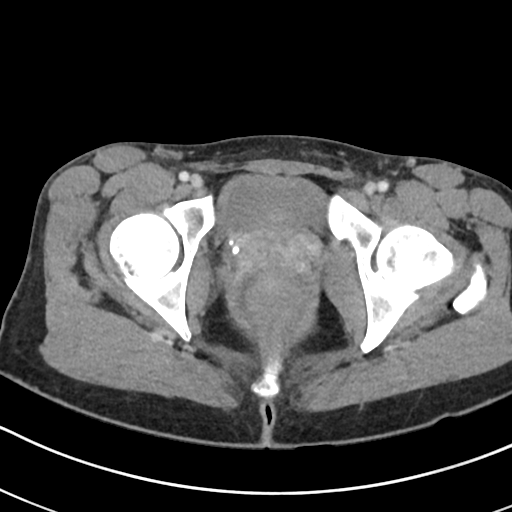
[im 24/90  soft-tissue]
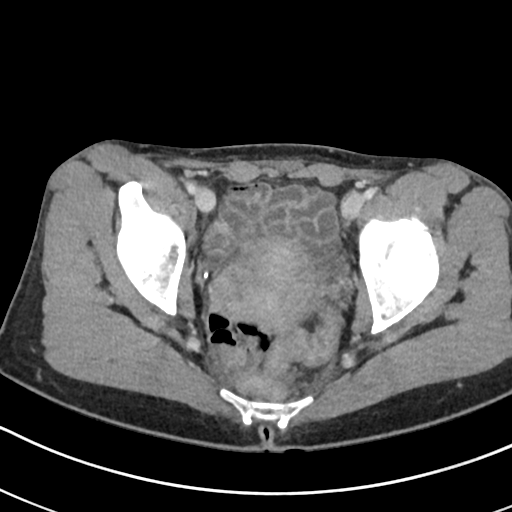
[im 33/90  soft-tissue]
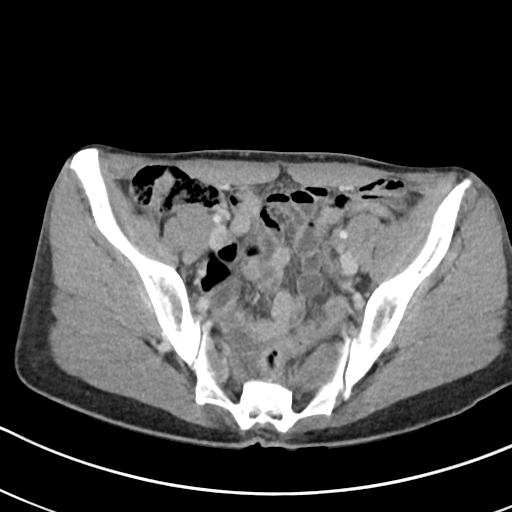
[im 38/90  soft-tissue]
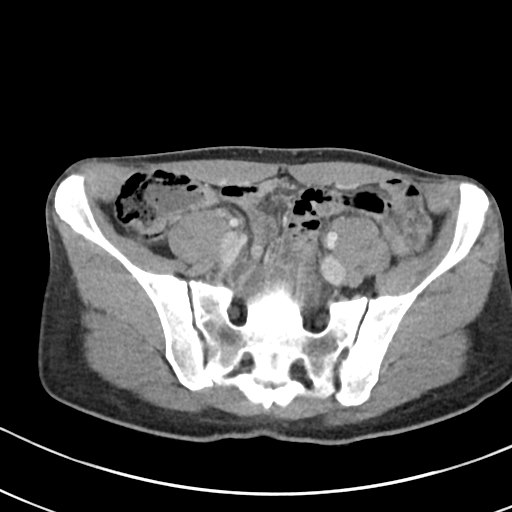
[im 47/90  soft-tissue]
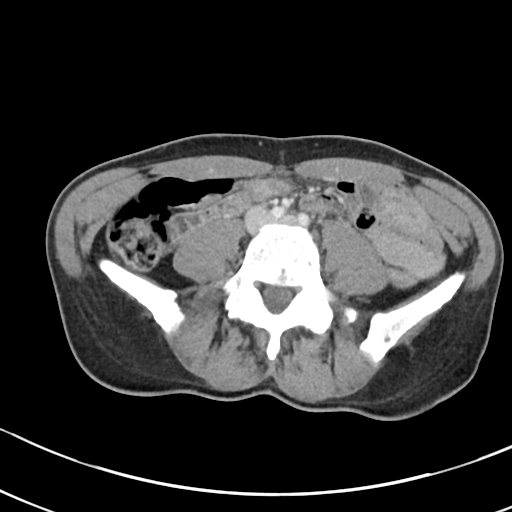
[im 52/90  soft-tissue]
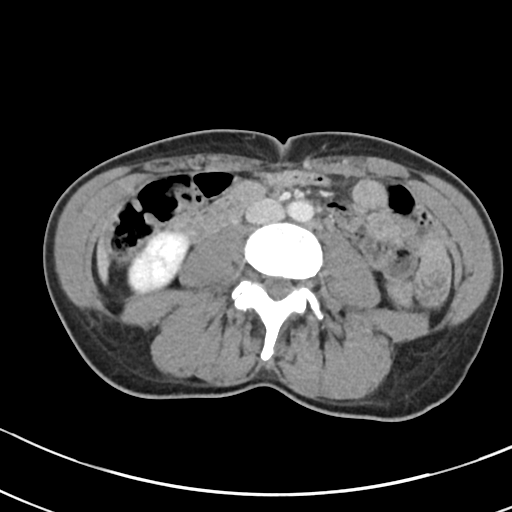
[im 57/90  soft-tissue]
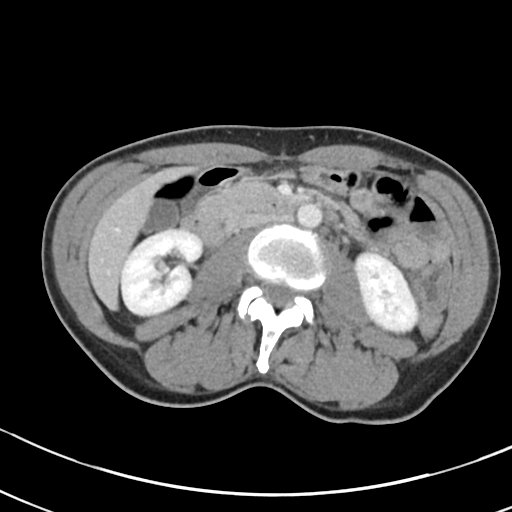
[im 57/90  bone]
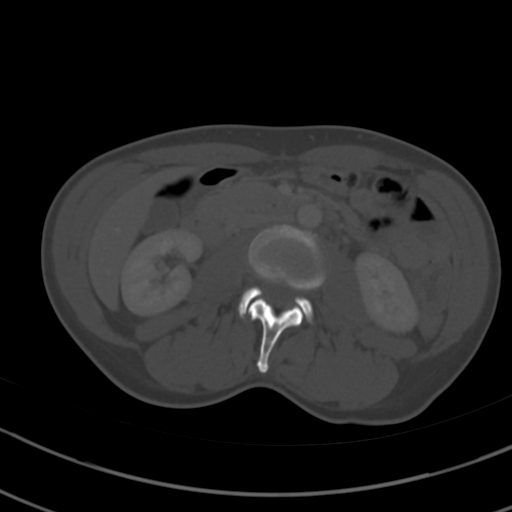
[im 66/90  soft-tissue]
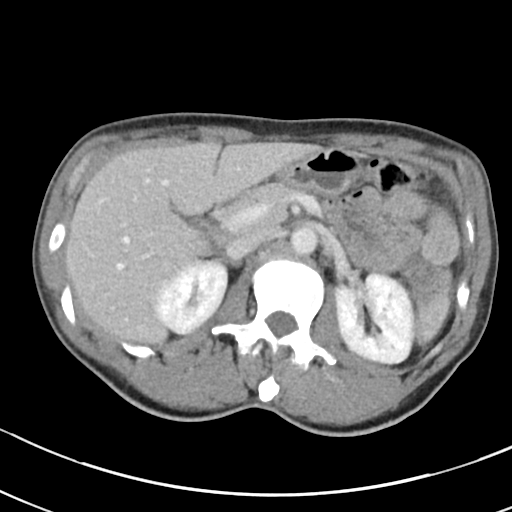
[im 71/90  soft-tissue]
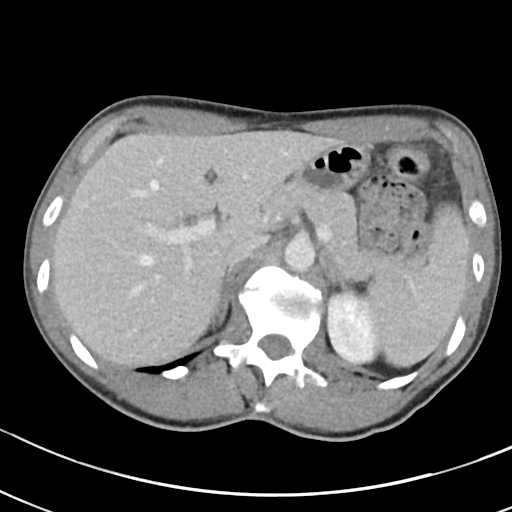
[im 75/90  soft-tissue]
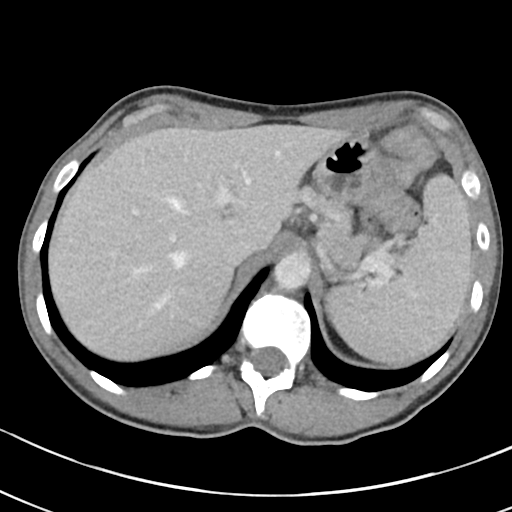
[im 85/90  soft-tissue]
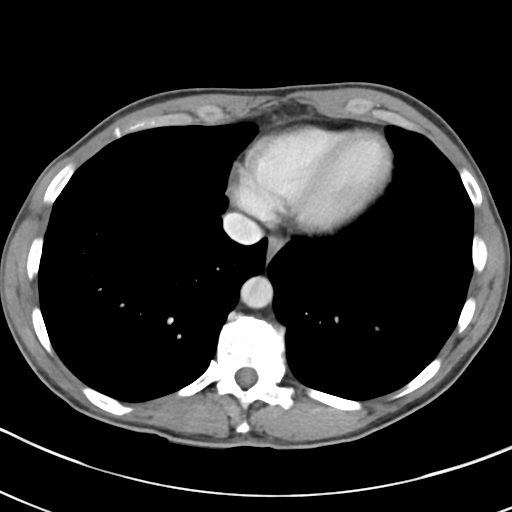

[Series 5: coronal · coronal · 0.64mm/px · 3 of 75 slices shown]
[im 25/75  soft-tissue]
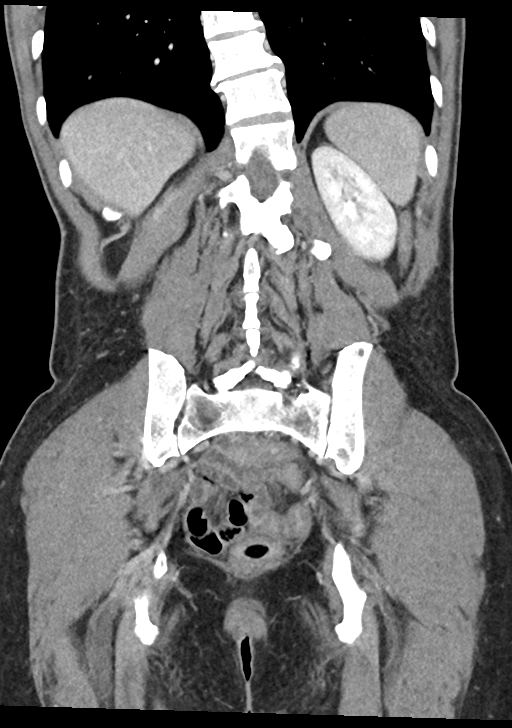
[im 33/75  soft-tissue]
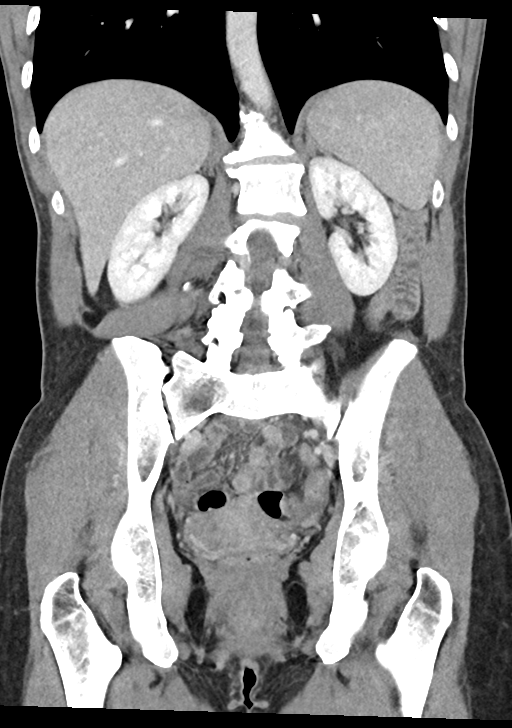
[im 42/75  soft-tissue]
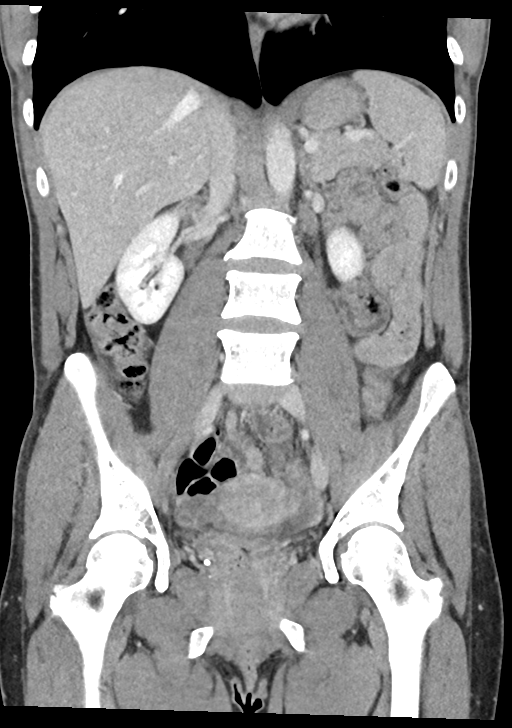

[16 of 46 positions shown; findings below may reference images not displayed]

RADIATION DOSE REDUCTION: This exam was performed according to the
departmental dose-optimization program which includes automated
exposure control, adjustment of the mA and/or kV according to
patient size and/or use of iterative reconstruction technique.

CONTRAST:  80mL OMNIPAQUE IOHEXOL 300 MG/ML  SOLN
FINDINGS: Lower chest: The visualized lung bases are clear.

No intra-abdominal free air or free fluid.

Hepatobiliary: No focal liver abnormality is seen. No gallstones,
gallbladder wall thickening, or biliary dilatation.

Pancreas: Unremarkable. No pancreatic ductal dilatation or
surrounding inflammatory changes.

Spleen: Normal in size without focal abnormality.

Adrenals/Urinary Tract: Adrenal glands are unremarkable. Kidneys are
normal, without renal calculi, focal lesion, or hydronephrosis.
Bladder is unremarkable.

Stomach/Bowel: There is no bowel obstruction or active inflammation.
The appendix is normal.

Vascular/Lymphatic: The abdominal aorta and IVC unremarkable. No
portal venous gas. There is no adenopathy.

Reproductive: The uterus is anteverted. There is a 12 mm corpus
luteum in the right ovary. The left ovary is unremarkable.

Other: None

Musculoskeletal: Degenerative changes of the spine. No acute osseous
pathology.
IMPRESSION: 1. No acute intra-abdominal or pelvic pathology. Normal appendix.
2. A 12 mm corpus luteum in the right ovary.

## 2021-06-15 MED ORDER — ONDANSETRON HCL 4 MG/2ML IJ SOLN
4.0000 mg | Freq: Once | INTRAMUSCULAR | Status: AC
  Administered 2021-06-15: 4 mg via INTRAVENOUS
  Filled 2021-06-15: qty 2

## 2021-06-15 MED ORDER — ONDANSETRON 4 MG PO TBDP: 4.0000 mg | ORAL_TABLET | Freq: Three times a day (TID) | ORAL | 0 refills | Status: DC | PRN

## 2021-06-15 MED ORDER — DICYCLOMINE HCL 20 MG PO TABS: 20.0000 mg | ORAL_TABLET | Freq: Two times a day (BID) | ORAL | 0 refills | Status: DC

## 2021-06-15 MED ORDER — KETOROLAC TROMETHAMINE 30 MG/ML IJ SOLN
30.0000 mg | Freq: Once | INTRAMUSCULAR | Status: AC
  Administered 2021-06-15: 30 mg via INTRAVENOUS
  Filled 2021-06-15: qty 1

## 2021-06-15 MED ORDER — IOHEXOL 300 MG/ML  SOLN
100.0000 mL | Freq: Once | INTRAMUSCULAR | Status: AC | PRN
  Administered 2021-06-15: 80 mL via INTRAVENOUS

## 2021-06-15 MED ORDER — LACTATED RINGERS IV BOLUS
1000.0000 mL | Freq: Once | INTRAVENOUS | Status: AC
  Administered 2021-06-15: 1000 mL via INTRAVENOUS

## 2021-06-15 NOTE — ED Triage Notes (Signed)
She c/o "GI bug" x 2 days. She states she is having fewer episodes of emesis (only one past 24 hours) and few diarrhea stools per day. She is in no distress. ?

## 2021-06-15 NOTE — ED Notes (Signed)
ED Provider at bedside. 

## 2021-06-15 NOTE — ED Provider Notes (Signed)
?[facility] ?Provider Note ? ? ?CSN: [id number] ?Arrival date & time: 06/15/21  1729 ? ?  ? ?History ? ?Chief Complaint  ?Patient presents with  ? Emesis  ? Diarrhea  ? ? ?Kristy [name] is a 51 y.o. female. ? ?HPI ? ?  ? ? ?Nausea, vomiting and diarrhea since Monday  ?Can't keep much down, taking chicken broth ?Leftover zofran, helped some  ?Right side hurts, comes and goes, at first thought it was a backache. Had a kidney infection in the past and that is how it started, had a UTI but didn't have urinary symptoms ? ?Didn't vomit today after starting zofran. Today diarrhea 4 times. ? ?No sick contacts, suspicious, foods, travel, abx ?Haven't been able to take anything for back pain, just comes and goes ?No abdominal pain ?Fever yesterday, didn't take temp, chills and hot ? ?Have not stopped any meds, started any meds ? ? ?Past Medical History:  ?Diagnosis Date  ? ADD (attention deficit disorder)   ? Anxiety   ? GERD (gastroesophageal reflux disease)   ? Wears contact lenses   ?  ?Home Medications ?Prior to Admission medications   ?Medication Sig Start Date End Date Taking? Authorizing Provider  ?ALPRAZolam (XANAX PO) Take 2.5 mg by mouth as needed.    [provider]  ?Amphetamine-Dextroamphetamine (MYDAYIS PO) Take 30 mg by mouth daily.    [provider]  ?dicyclomine (BENTYL) 20 MG tablet Take 1 tablet (20 mg total) by mouth 2 (two) times daily. 06/15/21   Gareth Morgan, MD  ?DULoxetine (CYMBALTA) 60 MG capsule Take 60 mg by mouth daily. 10/24/15   [provider]  ?ibuprofen (ADVIL,MOTRIN) 200 MG tablet Take 400-600 mg by mouth every 6 (six) hours as needed for headache, mild pain or moderate pain.    [provider]  ?mupirocin ointment (BACTROBAN) 2 % Place 1 application into the nose 2 (two) times daily. 05/15/18   Billie Ruddy, MD  ?nitrofurantoin, macrocrystal-monohydrate, (MACROBID) 100 MG capsule Take 1 capsule (100 mg total) by mouth 2  (two) times daily. ?Patient not taking: Reported on 12/08/2020 11/15/19   Emerson Monte, FNP  ?ondansetron (ZOFRAN-ODT) 4 MG disintegrating tablet Take 1 tablet (4 mg total) by mouth every 8 (eight) hours as needed for nausea or vomiting. 06/15/21   Gareth Morgan, MD  ?SUPREP BOWEL PREP KIT 17.5-3.13-1.6 GM/177ML SOLN Suprep-Use as directed ?Patient not taking: Reported on 12/08/2020 05/28/18   Armbruster, Carlota Raspberry, MD  ?   ? ?Allergies    ?Patient has no known allergies.   ? ?Review of Systems   ?Review of Systems ? ?Physical Exam ?Updated Vital Signs ?BP 129/81 (BP Location: Right Arm)   Pulse 75   Temp 98.3 ?F (36.8 ?C) (Oral)   Resp 16   SpO2 100%  ?Physical Exam ? ?ED Results / Procedures / Treatments   ?Labs ?(all labs ordered are listed, but only abnormal results are displayed) ?Labs Reviewed  ?COMPREHENSIVE METABOLIC PANEL - Abnormal; Notable for the following components:  ?    Result Value  ? Glucose, Bld 117 (*)   ? AST 13 (*)   ? All other components within normal limits  ?URINALYSIS, ROUTINE W REFLEX MICROSCOPIC - Abnormal; Notable for the following components:  ? APPearance HAZY (*)   ? Protein, ur TRACE (*)   ? All other components within normal limits  ?LIPASE, BLOOD  ?CBC  ?PREGNANCY, URINE  ? ? ?EKG ?None ? ?Radiology ?CT ABDOMEN PELVIS  W CONTRAST ? ?Result Date: 06/15/2021 ?CLINICAL DATA:  Right lower quadrant abdominal pain. EXAM: CT ABDOMEN AND PELVIS WITH CONTRAST TECHNIQUE: Multidetector CT imaging of the abdomen and pelvis was performed using the standard protocol following bolus administration of intravenous contrast. RADIATION DOSE REDUCTION: This exam was performed according to the departmental dose-optimization program which includes automated exposure control, adjustment of the mA and/or kV according to patient size and/or use of iterative reconstruction technique. CONTRAST:  77m OMNIPAQUE IOHEXOL 300 MG/ML  SOLN COMPARISON:  CT abdomen pelvis dated 02/10/2009. FINDINGS: Lower  chest: The visualized lung bases are clear. No intra-abdominal free air or free fluid. Hepatobiliary: No focal liver abnormality is seen. No gallstones, gallbladder wall thickening, or biliary dilatation. Pancreas: Unremarkable. No pancreatic ductal dilatation or surrounding inflammatory changes. Spleen: Normal in size without focal abnormality. Adrenals/Urinary Tract: Adrenal glands are unremarkable. Kidneys are normal, without renal calculi, focal lesion, or hydronephrosis. Bladder is unremarkable. Stomach/Bowel: There is no bowel obstruction or active inflammation. The appendix is normal. Vascular/Lymphatic: The abdominal aorta and IVC unremarkable. No portal venous gas. There is no adenopathy. Reproductive: The uterus is anteverted. There is a 12 mm corpus luteum in the right ovary. The left ovary is unremarkable. Other: None Musculoskeletal: Degenerative changes of the spine. No acute osseous pathology. IMPRESSION: 1. No acute intra-abdominal or pelvic pathology. Normal appendix. 2. A 12 mm corpus luteum in the right ovary. Electronically Signed   By: AAnner CreteM.D.   On: 06/15/2021 22:10   ? ?Procedures ?Procedures  ? ? ?Medications Ordered in ED ?Medications  ?lactated ringers bolus 1,000 mL (0 mLs Intravenous Stopped 06/15/21 2234)  ?ondansetron ([facility] injection 4 mg (4 mg Intravenous Given 06/15/21 2115)  ?ketorolac (TORADOL) 30 MG/ML injection 30 mg (30 mg Intravenous Given 06/15/21 2114)  ?iohexol (OMNIPAQUE) 300 MG/ML solution 100 mL (80 mLs Intravenous Contrast Given 06/15/21 2127)  ? ? ?ED Course/ Medical Decision Making/ A&P ?  ?                        ?Medical Decision Making ?Amount and/or Complexity of Data Reviewed ?Labs: ordered. ?Radiology: ordered. ? ?Risk ?Prescription drug management. ? ? ?51year old female with history above presents with concern for nausea, vomiting, diarrhea, and right-sided pain. ? ?Differential diagnosis includes gastroenteritis, pancreatitis, cholecystitis,  appendicitis, pyelonephritis, nephrolithiasis. ? ?Pregnancy test negative.  Urinalysis shows no sign of urinary tract infection.  History of mild right-sided/flank pain is not consistent with ovarian torsion, no other pelvic symptoms to suggest PID or TOA. ? ?History is most consistent with a gastroenteritis with nausea, vomiting and diarrhea, however given history of right-sided pain with right lower quadrant tenderness on exam, obtained CT abdomen pelvis to evaluate for signs of appendicitis. ? ?CT shows a normal-appearing appendix, no acute intra-abdominal or pelvic pathology and a 12 mm corpus luteum in the right ovary. ? ?Labs are completed and evaluated by me and showed no evidence of pancreatitis, hepatitis, or significant electrolyte abnormalities. ? ?Given IV fluids for hydration, Zofran. ? ?Given prescription for Zofran and Bentyl, recommend continued PCP follow-up for likely viral gastroenteritis. ? ? ? ? ? ? ? ?Final Clinical Impression(s) / ED Diagnoses ?Final diagnoses:  ?Generalized abdominal pain  ?Nausea vomiting and diarrhea  ? ? ?Rx / DC Orders ?ED Discharge Orders   ? ?      Ordered  ?  ondansetron (ZOFRAN-ODT) 4 MG disintegrating tablet  Every 8 hours PRN,   Status:  Discontinued       ?  06/15/21 2230  ?  dicyclomine (BENTYL) 20 MG tablet  2 times daily,   Status:  Discontinued       ? 06/15/21 2230  ?  dicyclomine (BENTYL) 20 MG tablet  2 times daily       ? 06/15/21 2258  ?  ondansetron (ZOFRAN-ODT) 4 MG disintegrating tablet  Every 8 hours PRN       ? 06/15/21 2258  ? ?  ?  ? ?  ? ? ?  ?Gareth Morgan, MD ?06/16/21 1118 ? ?

## 2022-05-05 ENCOUNTER — Emergency Department (HOSPITAL_BASED_OUTPATIENT_CLINIC_OR_DEPARTMENT_OTHER): Payer: Self-pay | Admitting: Radiology

## 2022-05-05 ENCOUNTER — Emergency Department (HOSPITAL_BASED_OUTPATIENT_CLINIC_OR_DEPARTMENT_OTHER)
Admission: EM | Admit: 2022-05-05 | Discharge: 2022-05-05 | Disposition: A | Discharge status: Home / Self Care | Payer: Self-pay | Attending: Emergency Medicine | Admitting: Emergency Medicine

## 2022-05-05 ENCOUNTER — Encounter (HOSPITAL_BASED_OUTPATIENT_CLINIC_OR_DEPARTMENT_OTHER): Payer: Self-pay | Admitting: Emergency Medicine

## 2022-05-05 ENCOUNTER — Other Ambulatory Visit: Payer: Self-pay

## 2022-05-05 ENCOUNTER — Ambulatory Visit: Payer: Self-pay

## 2022-05-05 DIAGNOSIS — S62664A Nondisplaced fracture of distal phalanx of right ring finger, initial encounter for closed fracture: Secondary | ICD-10-CM | POA: Insufficient documentation

## 2022-05-05 DIAGNOSIS — S62639A Displaced fracture of distal phalanx of unspecified finger, initial encounter for closed fracture: Secondary | ICD-10-CM

## 2022-05-05 DIAGNOSIS — W231XXA Caught, crushed, jammed, or pinched between stationary objects, initial encounter: Secondary | ICD-10-CM | POA: Insufficient documentation

## 2022-05-05 MED ORDER — CEPHALEXIN 500 MG PO CAPS: 500.0000 mg | ORAL_CAPSULE | Freq: Four times a day (QID) | ORAL | 0 refills | Status: DC

## 2022-05-05 NOTE — ED Provider Notes (Signed)
[facility] Provider Note   CSN: [id number] Arrival date & time: 05/05/22  1749     History  Chief Complaint  Patient presents with   Finger Injury    Kristy [name] is a 52 y.o. female.  HPI   Patient presents to the ED for evaluation of finger injury.  Patient was picking up a recliner when it pinched her finger in the hinge of the chair.  Patient avulsed portion of her fingernail.  She sustained a small laceration.  Patient denies any numbness weakness.  Home Medications Prior to Admission medications   Medication Sig Start Date End Date Taking? Authorizing Provider  cephALEXin (KEFLEX) 500 MG capsule Take 1 capsule (500 mg total) by mouth 4 (four) times daily. 05/05/22  Yes Dorie Rank, MD  ALPRAZolam (XANAX PO) Take 2.5 mg by mouth as needed.    [provider]  Amphetamine-Dextroamphetamine (MYDAYIS PO) Take 30 mg by mouth daily.    [provider]  dicyclomine (BENTYL) 20 MG tablet Take 1 tablet (20 mg total) by mouth 2 (two) times daily. 06/15/21   Gareth Morgan, MD  DULoxetine (CYMBALTA) 60 MG capsule Take 60 mg by mouth daily. 10/24/15   [provider]  ibuprofen (ADVIL,MOTRIN) 200 MG tablet Take 400-600 mg by mouth every 6 (six) hours as needed for headache, mild pain or moderate pain.    [provider]  mupirocin ointment (BACTROBAN) 2 % Place 1 application into the nose 2 (two) times daily. 05/15/18   Billie Ruddy, MD  nitrofurantoin, macrocrystal-monohydrate, (MACROBID) 100 MG capsule Take 1 capsule (100 mg total) by mouth 2 (two) times daily. Patient not taking: Reported on 12/08/2020 11/15/19   Emerson Monte, FNP  ondansetron (ZOFRAN-ODT) 4 MG disintegrating tablet Take 1 tablet (4 mg total) by mouth every 8 (eight) hours as needed for nausea or vomiting. 06/15/21   Gareth Morgan, MD  SUPREP BOWEL PREP KIT 17.5-3.13-1.6 GM/177ML SOLN Suprep-Use as directed Patient not taking:  Reported on 12/08/2020 05/28/18   Armbruster, Carlota Raspberry, MD      Allergies    Patient has no known allergies.    Review of Systems   Review of Systems  Physical Exam Updated Vital Signs BP 126/79 (BP Location: Right Arm)   Pulse 88   Temp 98.5 F (36.9 C) (Oral)   Resp 17   SpO2 100%  Physical Exam Vitals and nursing note reviewed.  Constitutional:      General: She is not in acute distress.    Appearance: She is well-developed.  HENT:     Head: Normocephalic and atraumatic.     Right Ear: External ear normal.     Left Ear: External ear normal.  Eyes:     General: No scleral icterus.       Right eye: No discharge.        Left eye: No discharge.     Conjunctiva/sclera: Conjunctivae normal.  Neck:     Trachea: No tracheal deviation.  Cardiovascular:     Rate and Rhythm: Normal rate.  Pulmonary:     Effort: Pulmonary effort is normal. No respiratory distress.     Breath sounds: No stridor.  Abdominal:     General: There is no distension.  Musculoskeletal:        General: No swelling or deformity.     Cervical back: Neck supple.     Comments: Superficial laceration noted at the distal aspect of the fingertip of  the right ring finger, no active bleeding, wound edges well-approximated, fingernail has been partially avulsed distal to that  Skin:    General: Skin is warm and dry.     Findings: No rash.  Neurological:     Mental Status: She is alert. Mental status is at baseline.     Cranial Nerves: No dysarthria or facial asymmetry.     Motor: No seizure activity.     ED Results / Procedures / Treatments   Labs (all labs ordered are listed, but only abnormal results are displayed) Labs Reviewed - No data to display  EKG None  Radiology DG Finger Ring Right  Result Date: 05/05/2022 CLINICAL DATA: Smashed finger tip in a recliner. EXAM: RIGHT RING FINGER 2+V COMPARISON:  None Available. FINDINGS: Acute fracture identified at the extreme tip of the distal phalangeal  tuft. No other acute fracture evident. No subluxation or dislocation. IMPRESSION: Acute cortical fracture at the very tip of the distal phalangeal tuft. Electronically Signed   By: Misty Stanley M.D.   On: 05/05/2022 18:38    Procedures Procedures    Medications Ordered in ED Medications - No data to display  ED Course/ Medical Decision Making/ A&P Clinical Course as of 05/05/22 1941  Fri May 05, 2022  1905 X-ray does show a fracture at the tip of the finger [JK]    Clinical Course User Index [JK] Dorie Rank, MD                             Medical Decision Making Amount and/or Complexity of Data Reviewed Radiology: ordered.   Patient's x-rays show a distal tuft fracture.  No signs of any significant laceration requiring repair.  Will place antibiotic ointment and splint.  Will start her on antibiotics considering her fracture.  Shot is up-to-date.  Will refer to orthopedics for outpatient follow-up.        Final Clinical Impression(s) / ED Diagnoses Final diagnoses:  Closed fracture of tuft of distal phalanx of finger    Rx / DC Orders ED Discharge Orders          Ordered    cephALEXin (KEFLEX) 500 MG capsule  4 times daily        05/05/22 1941              Dorie Rank, MD 05/05/22 1941

## 2022-05-05 NOTE — ED Triage Notes (Signed)
Pt reports picking up a recliner and pinched her finger in the hinge of the chair. Half of patients finger nail on the right ring finger has been cut. Active bleeding at this time.

## 2022-05-05 NOTE — Telephone Encounter (Signed)
  Chief Complaint: Removed right ring finger fingernail Symptoms: Pain, bleeding Frequency: 15 minutes ago Pertinent Negatives: Patient denies  Disposition: []$ ED /[x]$ Urgent Care (no appt availability in office) / []$ Appointment(In office/virtual)/ []$  [facility]/ []$ Home Care/ []$ Refused Recommended Disposition /[]$ [facility]/ []$  Follow-up with PCP Additional Notes: Pt will go to UC for care.  Reason for Disposition  Fingernail is completely torn off (fingernail avulsion)  Answer Assessment - Initial Assessment Questions 1. MECHANISM: "How did the injury happen?"      Finger got stuck inhinge 2. ONSET: "When did the injury happen?" (Minutes or hours ago)      15 minutes 3. LOCATION: "What part of the finger is injured?" "Is the nail damaged?"      Ripped off right ring finger nail 4. APPEARANCE of the INJURY: "What does the injury look like?"       5. SEVERITY: "Can you use the hand normally?"  "Can you bend your fingers into a ball and then fully open them?"      6. SIZE: For cuts, bruises, or swelling, ask: "How large is it?" (e.g., inches or centimeters;  entire finger)       7. PAIN: "Is there pain?" If Yes, ask: "How bad is the pain?"    (e.g., Scale 1-10; or mild, moderate, severe)  - NONE (0): no pain.  - MILD (1-3): doesn't interfere with normal activities.   - MODERATE (4-7): interferes with normal activities or awakens from sleep.  - SEVERE (8-10): excruciating pain, unable to hold a glass of water or bend finger even a little.     moderate 8. TETANUS: For any breaks in the skin, ask: "When was the last tetanus booster?"     Less than 5 years ago 9. OTHER SYMPTOMS: "Do you have any other symptoms?"     Bleeding 10. PREGNANCY: "Is there any chance you are pregnant?" "When was your last menstrual period?"  Protocols used: Finger Injury-A-AH

## 2022-05-05 NOTE — Discharge Instructions (Signed)
Keep the splint on for protection.  Take the antibiotics as prescribed.  Follow-up with the orthopedic doctor for further evaluation

## 2022-05-05 NOTE — ED Notes (Signed)
Applied bacitracin ointment to pt right ring finger, dressed with gauze and coban.

## 2023-03-26 ENCOUNTER — Telehealth: Payer: Self-pay | Admitting: Family Medicine

## 2023-03-26 NOTE — Telephone Encounter (Signed)
 Called Pt to schedule CPE. LVM for Pt to call us back to schedule.

## 2023-07-08 NOTE — Progress Notes (Signed)
 Brigitte Canard, PA-C [address] [place], [place]  [address] Phone: [phone]   Gastroenterology Consultation  Referring Provider:     Viola Greulich, MD Primary Care Physician:  Viola Greulich, MD Primary Gastroenterologist:  Brigitte Canard, PA-C / Alvester Johnson, MD  Reason for Consultation:     Constipation, discuss colonoscopy         HPI:   Kristy [name] is a 53 y.o. y/o female referred for consultation & management  by Viola Greulich, MD.    Patient last saw Dr. General Kenner 05/2018 to evaluate rectal bleeding and hemorrhoids.  Colonoscopy was recommended, however patient did not follow through.  Anoscopy showed grade 2 internal hemorrhoids.  Treated with Anusol  suppositories.  No previous colonoscopy.  Her paternal grandfather had colon cancer.  Current symptoms: Patient states she is having worsening constipation since December 2024.  She tried OTC MiraLAX with little benefit.  Most recently she took magnesium citrate which is helping a little.  She states she has not had a good bowel movement in the past month.  She has had a few small hard bowel movements in the past few days.  She has a lot of straining and hard stools.  She has noticed bright red blood on the tissue and in the toilet after straining to have a bowel movement.  She attributes this to hemorrhoids.  She has generalized diffuse abdominal discomfort and feels full.  Worse in the right upper quadrant.  Has mild nausea but no vomiting.  Denies weight loss.  She has not been drinking many fluids or eating much fiber in her diet in the past 6 months.   Past Medical History:  Diagnosis Date   ADD (attention deficit disorder)    Anxiety    Depression    GERD (gastroesophageal reflux disease)    Wears contact lenses     Past Surgical History:  Procedure Laterality Date   CERVICAL ABLATION  2017   CESAREAN SECTION  2006   SHOULDER ACROMIOPLASTY Left 06/24/2014   Procedure: SHOULDER  ACROMIOPLASTY;  Surgeon: Marlena Sima, MD;  Location: [facility];  Service: Orthopedics;  Laterality: Left;   SHOULDER ARTHROSCOPY Left 06/24/2014   Procedure: ARTHROSCOPY LEFT SHOULDER WITH MANIPULATION AND LYSIS OF ADHESIONS;  Surgeon: Marlena Sima, MD;  Location: [facility];  Service: Orthopedics;  Laterality: Left;   WISDOM TOOTH EXTRACTION      Prior to Admission medications   Medication Sig Start Date End Date Taking? Authorizing Provider  amphetamine-dextroamphetamine (ADDERALL) 10 MG tablet Take 20 mg by mouth 2 (two) times daily with a meal.   Yes [provider]  buPROPion  (WELLBUTRIN  SR) 150 MG 12 hr tablet Take 150 mg by mouth daily.   Yes [provider]  Chlorpheniramine-PSE-Ibuprofen (ADVIL ALLERGY SINUS PO) Take 1 tablet by mouth as needed.   Yes [provider]  DULoxetine (CYMBALTA) 60 MG capsule Take 60 mg by mouth daily. 10/24/15  Yes [provider]  ibuprofen (ADVIL,MOTRIN) 200 MG tablet Take 400-600 mg by mouth every 6 (six) hours as needed for headache, mild pain or moderate pain.   Yes [provider]  linaclotide  (LINZESS ) 145 MCG CAPS capsule Take 1 capsule (145 mcg total) by mouth daily before breakfast. 07/09/23  Yes Brigitte Canard, PA-C  linaclotide  (LINZESS ) 290 MCG CAPS capsule Take 1 capsule (290 mcg total) by mouth daily before breakfast. 07/09/23  Yes Brigitte Canard, PA-C  Na Sulfate-K Sulfate-Mg  Sulfate concentrate (SUPREP) 17.5-3.13-1.6 GM/177ML SOLN Take 1 kit (354 mLs total) by mouth once for 1 dose. 07/09/23 07/09/23 Yes Brigitte Canard, PA-C     Family History  Problem Relation Age of Onset   Hyperlipidemia Mother    Diabetes Father    Breast cancer Maternal Grandmother    Diabetes Maternal Grandfather    Stroke Maternal Grandfather    Parkinson's disease Paternal Grandmother    Colon cancer Paternal Grandfather    Heart disease Paternal Grandfather      Social History    Tobacco Use   Smoking status: Every Day    Current packs/day: 0.00    Types: Cigarettes    Last attempt to quit: 06/19/1998    Years since quitting: 25.0   Smokeless tobacco: Never   Tobacco comments:    quit 11  years ago  Vaping Use   Vaping status: Never Used  Substance Use Topics   Alcohol use: Yes    Comment: 2-3 times per week   Drug use: No    Allergies as of 07/09/2023   (No Known Allergies)    Review of Systems:    All systems reviewed and negative except where noted in HPI.   Physical Exam:  BP 110/62   Pulse 97   Ht 5' 5.5" (1.664 m)   Wt 136 lb 3.2 oz (61.8 kg)   SpO2 98%   BMI 22.32 kg/m  No LMP recorded. Patient has had an ablation.  General:   Alert,  Well-developed, well-nourished, pleasant and cooperative in NAD Lungs:  Respirations even and unlabored.  Clear throughout to auscultation.   No wheezes, crackles, or rhonchi. No acute distress. Heart:  Regular rate and rhythm; no murmurs, clicks, rubs, or gallops. Abdomen:  Normal bowel sounds.  No bruits.  Soft, and non-distended without masses, hepatosplenomegaly or hernias noted.  Mild RUQ and bilateral lower abdominal Tenderness.  No guarding or rebound tenderness.    Rectal: Patient declined rectal exam until time of Colonoscopy. Neurologic:  Alert and oriented x3;  grossly normal neurologically. Psych:  Alert and cooperative.   Anxious mood and affect.  Imaging Studies: No results found.  Assessment and Plan:   Kristy [name] is a 53 y.o. y/o female has been referred for:  1.  Constipation / Generalized Abdominal Pain -Labs: CBC, CMP, Lipase, TSH -Abdominal Xray (KUB): Evaluate Stool Burden -Gave samples of Linzess  145 mcg QD for 1 week, then 290 mcg QD for 1 week.  She will let me know which dose works best, and then we can send a prescription.  -Recommend High Fiber diet with fruits, vegetables, and whole grains. -Drink 64 ounces of Fluids Daily.  2.  Hemorrhoids -Rx Hydrocortisone   2.5% Cream, apply 2-3 times daily as needed.  3.  Rectal bleeding  Scheduling Colonoscopy I discussed risks of colonoscopy with patient to include risk of bleeding, colon perforation, and risk of sedation.  Patient expressed understanding and agrees to proceed with colonoscopy.   4.  Colon cancer screening - No previous Colonoscopy  Scheduling Colonoscopy   Follow up 4 weeks after Colonoscopy with TG.  Brigitte Canard, PA-C

## 2023-07-09 ENCOUNTER — Other Ambulatory Visit (INDEPENDENT_AMBULATORY_CARE_PROVIDER_SITE_OTHER)

## 2023-07-09 ENCOUNTER — Encounter: Payer: Self-pay | Admitting: Physician Assistant

## 2023-07-09 ENCOUNTER — Ambulatory Visit (INDEPENDENT_AMBULATORY_CARE_PROVIDER_SITE_OTHER)
Admission: RE | Admit: 2023-07-09 | Discharge: 2023-07-09 | Disposition: A | Discharge status: Home / Self Care | Source: Ambulatory Visit | Attending: Physician Assistant | Admitting: Physician Assistant

## 2023-07-09 ENCOUNTER — Ambulatory Visit: Admitting: Physician Assistant

## 2023-07-09 VITALS — BP 110/62 | HR 97 | Ht 65.5 in | Wt 136.2 lb

## 2023-07-09 DIAGNOSIS — K5904 Chronic idiopathic constipation: Secondary | ICD-10-CM

## 2023-07-09 DIAGNOSIS — K5900 Constipation, unspecified: Secondary | ICD-10-CM

## 2023-07-09 DIAGNOSIS — K649 Unspecified hemorrhoids: Secondary | ICD-10-CM | POA: Diagnosis not present

## 2023-07-09 DIAGNOSIS — K625 Hemorrhage of anus and rectum: Secondary | ICD-10-CM

## 2023-07-09 DIAGNOSIS — R1084 Generalized abdominal pain: Secondary | ICD-10-CM | POA: Diagnosis not present

## 2023-07-09 LAB — CBC WITH DIFFERENTIAL/PLATELET
Basophils Absolute: 0 10*3/uL (ref 0.0–0.1)
Basophils Relative: 0.5 % (ref 0.0–3.0)
Eosinophils Absolute: 0.2 10*3/uL (ref 0.0–0.7)
Eosinophils Relative: 3.5 % (ref 0.0–5.0)
HCT: 37.3 % (ref 36.0–46.0)
Hemoglobin: 12.9 g/dL (ref 12.0–15.0)
Lymphocytes Relative: 33.4 % (ref 12.0–46.0)
Lymphs Abs: 1.8 10*3/uL (ref 0.7–4.0)
MCHC: 34.7 g/dL (ref 30.0–36.0)
MCV: 89 fl (ref 78.0–100.0)
Monocytes Absolute: 0.6 10*3/uL (ref 0.1–1.0)
Monocytes Relative: 10.2 % (ref 3.0–12.0)
Neutro Abs: 2.9 10*3/uL (ref 1.4–7.7)
Neutrophils Relative %: 52.4 % (ref 43.0–77.0)
Platelets: 204 10*3/uL (ref 150.0–400.0)
RBC: 4.19 Mil/uL (ref 3.87–5.11)
RDW: 12.7 % (ref 11.5–15.5)
WBC: 5.5 10*3/uL (ref 4.0–10.5)

## 2023-07-09 LAB — COMPREHENSIVE METABOLIC PANEL WITH GFR
ALT: 12 U/L (ref 0–35)
AST: 14 U/L (ref 0–37)
Albumin: 4.1 g/dL (ref 3.5–5.2)
Alkaline Phosphatase: 59 U/L (ref 39–117)
BUN: 16 mg/dL (ref 6–23)
CO2: 31 meq/L (ref 19–32)
Calcium: 9.1 mg/dL (ref 8.4–10.5)
Chloride: 101 meq/L (ref 96–112)
Creatinine, Ser: 0.94 mg/dL (ref 0.40–1.20)
GFR: 69.52 mL/min (ref >60.00)
Glucose, Bld: 98 mg/dL (ref 70–99)
Potassium: 4.2 meq/L (ref 3.5–5.1)
Sodium: 138 meq/L (ref 135–145)
Total Bilirubin: 0.6 mg/dL (ref 0.2–1.2)
Total Protein: 6.4 g/dL (ref 6.0–8.3)

## 2023-07-09 LAB — LIPASE: Lipase: 133 U/L — ABNORMAL HIGH (ref 11.0–59.0)

## 2023-07-09 LAB — TSH: TSH: 1.39 u[IU]/mL (ref 0.35–5.50)

## 2023-07-09 MED ORDER — LINACLOTIDE 290 MCG PO CAPS: 290.0000 ug | ORAL_CAPSULE | Freq: Every day | ORAL | Status: DC

## 2023-07-09 MED ORDER — LINACLOTIDE 145 MCG PO CAPS: 145.0000 ug | ORAL_CAPSULE | Freq: Every day | ORAL | Status: DC

## 2023-07-09 MED ORDER — NA SULFATE-K SULFATE-MG SULF 17.5-3.13-1.6 GM/177ML PO SOLN: 1.0000 | Freq: Once | ORAL | 0 refills | Status: AC

## 2023-07-09 NOTE — Progress Notes (Signed)
 Call and notify patient labs show: 1.  Lipase pancreas enzyme is elevated.  Has she drank any alcohol recently?  Please stop all Alcohol.  Drink lots of water (64 ounces / day).  **I recommend Schedule Abdominal CT with contrast.  Diagnosis: Abdominal Pain and Elevated Lipase.  Rule out pancreatitis. 2.  Normal TSH thyroid  test. 3.  Normal CBC and CMP.  White count, hemoglobin, kidney and liver test are normal.  No evidence of infection or anemia. Brigitte Canard, PA-C

## 2023-07-09 NOTE — Patient Instructions (Addendum)
 For Constipation Try: Samples of Linzess  290 mcg QD for 1 week,  then 145 mcg QD for 1 week.   Please let me know which dose works best, and then we can call in a prescription.    Drink at least 64 ounces of fluids daily. Eat High Fiber Diet (30 grams daily) with fruits, vegetables and whole grains.  Your provider has requested that you go to the basement level for a x-ray before leaving today. Press "B" on the elevator.   You have been scheduled for a colonoscopy. Please follow written instructions given to you at your visit today.   If you use inhalers (even only as needed), please bring them with you on the day of your procedure.  DO NOT TAKE 7 DAYS PRIOR TO TEST- Trulicity (dulaglutide) Ozempic, Wegovy (semaglutide) Mounjaro (tirzepatide) Bydureon Bcise (exanatide extended release)  DO NOT TAKE 1 DAY PRIOR TO YOUR TEST Rybelsus (semaglutide) Adlyxin (lixisenatide) Victoza (liraglutide) Byetta (exanatide) ___________________________________________________________________________

## 2023-07-09 NOTE — Progress Notes (Signed)
 Agree with assessment and plan as outlined.

## 2023-07-10 ENCOUNTER — Other Ambulatory Visit: Payer: Self-pay

## 2023-07-10 DIAGNOSIS — R1084 Generalized abdominal pain: Secondary | ICD-10-CM

## 2023-07-20 ENCOUNTER — Ambulatory Visit (HOSPITAL_BASED_OUTPATIENT_CLINIC_OR_DEPARTMENT_OTHER)
Admission: RE | Admit: 2023-07-20 | Discharge: 2023-07-20 | Disposition: A | Discharge status: Home / Self Care | Source: Ambulatory Visit | Attending: Physician Assistant | Admitting: Physician Assistant

## 2023-07-20 ENCOUNTER — Other Ambulatory Visit (HOSPITAL_COMMUNITY)

## 2023-07-20 DIAGNOSIS — R1084 Generalized abdominal pain: Secondary | ICD-10-CM | POA: Insufficient documentation

## 2023-07-20 MED ORDER — IOHEXOL 300 MG/ML  SOLN
80.0000 mL | Freq: Once | INTRAMUSCULAR | Status: AC | PRN
  Administered 2023-07-20: 80 mL via INTRAVENOUS

## 2023-08-30 ENCOUNTER — Ambulatory Visit: Admitting: Gastroenterology

## 2023-08-30 ENCOUNTER — Telehealth: Payer: Self-pay | Admitting: Gastroenterology

## 2023-08-30 ENCOUNTER — Encounter: Payer: Self-pay | Admitting: Gastroenterology

## 2023-08-30 VITALS — BP 120/73 | HR 67 | Temp 98.0°F | Resp 13 | Ht 65.5 in | Wt 136.0 lb

## 2023-08-30 DIAGNOSIS — D126 Benign neoplasm of colon, unspecified: Secondary | ICD-10-CM

## 2023-08-30 DIAGNOSIS — D123 Benign neoplasm of transverse colon: Secondary | ICD-10-CM

## 2023-08-30 DIAGNOSIS — K5904 Chronic idiopathic constipation: Secondary | ICD-10-CM

## 2023-08-30 DIAGNOSIS — K6289 Other specified diseases of anus and rectum: Secondary | ICD-10-CM | POA: Diagnosis not present

## 2023-08-30 DIAGNOSIS — K649 Unspecified hemorrhoids: Secondary | ICD-10-CM

## 2023-08-30 DIAGNOSIS — K625 Hemorrhage of anus and rectum: Secondary | ICD-10-CM

## 2023-08-30 MED ORDER — SODIUM CHLORIDE 0.9 % IV SOLN: 500.0000 mL | Freq: Once | INTRAVENOUS | Status: AC

## 2023-08-30 NOTE — Progress Notes (Signed)
 [facility] History and Physical   Primary Care Physician:  Viola Greulich, MD   Reason for Procedure:   Rectal bleeding, constipation  Plan:    colonoscopy     HPI: Kristy [name] is a 53 y.o. female  here for colonoscopy  to evaluate rectal bleeding in the setting of constipation. No family history of colon cancer known. Otherwise feels well without any cardiopulmonary symptoms. First time exam.   I have discussed risks / benefits of anesthesia and endoscopic procedure with Arland Bellis and they wish to proceed with the exams as outlined today.    Past Medical History:  Diagnosis Date   ADD (attention deficit disorder)    Anxiety    Depression    GERD (gastroesophageal reflux disease)    Wears contact lenses     Past Surgical History:  Procedure Laterality Date   CERVICAL ABLATION  2017   CESAREAN SECTION  2006   SHOULDER ACROMIOPLASTY Left 06/24/2014   Procedure: SHOULDER ACROMIOPLASTY;  Surgeon: Marlena Sima, MD;  Location: [facility];  Service: Orthopedics;  Laterality: Left;   SHOULDER ARTHROSCOPY Left 06/24/2014   Procedure: ARTHROSCOPY LEFT SHOULDER WITH MANIPULATION AND LYSIS OF ADHESIONS;  Surgeon: Marlena Sima, MD;  Location: [facility];  Service: Orthopedics;  Laterality: Left;   WISDOM TOOTH EXTRACTION      Prior to Admission medications   Medication Sig Start Date End Date Taking? Authorizing Provider  amphetamine-dextroamphetamine (ADDERALL) 10 MG tablet Take 20 mg by mouth 2 (two) times daily with a meal.   Yes [provider]  buPROPion  (WELLBUTRIN  SR) 150 MG 12 hr tablet Take 150 mg by mouth daily.   Yes [provider]  DULoxetine (CYMBALTA) 60 MG capsule Take 60 mg by mouth daily. 10/24/15  Yes [provider]  Chlorpheniramine-PSE-Ibuprofen (ADVIL ALLERGY SINUS PO) Take 1 tablet by mouth as needed.    [provider]  ibuprofen (ADVIL,MOTRIN) 200 MG tablet Take  400-600 mg by mouth every 6 (six) hours as needed for headache, mild pain or moderate pain.    [provider]  linaclotide  (LINZESS ) 145 MCG CAPS capsule Take 1 capsule (145 mcg total) by mouth daily before breakfast. 07/09/23   Brigitte Canard, PA-C  linaclotide  (LINZESS ) 290 MCG CAPS capsule Take 1 capsule (290 mcg total) by mouth daily before breakfast. 07/09/23   Brigitte Canard, PA-C    Current Outpatient Medications  Medication Sig Dispense Refill   amphetamine-dextroamphetamine (ADDERALL) 10 MG tablet Take 20 mg by mouth 2 (two) times daily with a meal.     buPROPion  (WELLBUTRIN  SR) 150 MG 12 hr tablet Take 150 mg by mouth daily.     DULoxetine (CYMBALTA) 60 MG capsule Take 60 mg by mouth daily.     Chlorpheniramine-PSE-Ibuprofen (ADVIL ALLERGY SINUS PO) Take 1 tablet by mouth as needed.     ibuprofen (ADVIL,MOTRIN) 200 MG tablet Take 400-600 mg by mouth every 6 (six) hours as needed for headache, mild pain or moderate pain.     linaclotide  (LINZESS ) 145 MCG CAPS capsule Take 1 capsule (145 mcg total) by mouth daily before breakfast.     linaclotide  (LINZESS ) 290 MCG CAPS capsule Take 1 capsule (290 mcg total) by mouth daily before breakfast.     Current Facility-Administered Medications  Medication Dose Route Frequency Provider Last Rate Last Admin   0.9 %  sodium chloride  infusion  500 mL Intravenous Once Averleigh Savary, Lendon Queen, MD  Allergies as of 08/30/2023   (No Known Allergies)    Family History  Problem Relation Age of Onset   Hyperlipidemia Mother    Diabetes Father    Breast cancer Maternal Grandmother    Diabetes Maternal Grandfather    Stroke Maternal Grandfather    Parkinson's disease Paternal Grandmother    Colon cancer Paternal Grandfather    Heart disease Paternal Grandfather    Esophageal cancer Neg Hx    Stomach cancer Neg Hx    Rectal cancer Neg Hx     Social History   Socioeconomic History   Marital status: Divorced    Spouse name: Not on  file   Number of children: 2   Years of education: Not on file   Highest education level: Not on file  Occupational History   Occupation: Education administrator  Tobacco Use   Smoking status: Every Day    Current packs/day: 0.00    Types: Cigarettes    Last attempt to quit: 06/19/1998    Years since quitting: 25.2   Smokeless tobacco: Never   Tobacco comments:    quit 11  years ago  Vaping Use   Vaping status: Never Used  Substance and Sexual Activity   Alcohol use: Yes    Comment: 2-3 times per week   Drug use: Yes    Types: Marijuana    Comment: occ   Sexual activity: Not on file  Other Topics Concern   Not on file  Social History Narrative   Not on file   Social Drivers of Health   Financial Resource Strain: Not on file  Food Insecurity: Not on file  Transportation Needs: Not on file  Physical Activity: Not on file  Stress: Not on file  Social Connections: Unknown (05/17/2022)   Received [facility]   Social Network    Social Network: Not on file  Intimate Partner Violence: Unknown (05/17/2022)   Received from [facility]   HITS    Physically Hurt: Not on file    Insult or Talk Down To: Not on file    Threaten Physical Harm: Not on file    Scream or Curse: Not on file    Review of Systems: All other review of systems negative except as mentioned in the HPI.  Physical Exam: Vital signs BP 123/83   Pulse 91   Temp 98 F (36.7 C)   Ht 5' 5.5 (1.664 m)   Wt 136 lb (61.7 kg)   SpO2 100%   BMI 22.29 kg/m   General:   Alert,  Well-developed, pleasant and cooperative in NAD Lungs:  Clear throughout to auscultation.   Heart:  Regular rate and rhythm Abdomen:  Soft, nontender and nondistended.   Neuro/Psych:  Alert and cooperative. Normal mood and affect. A and O x 3  Christi Coward, MD [facility]

## 2023-08-30 NOTE — Op Note (Signed)
 [facility] Patient Name: Kristy [name] Procedure Date: 08/30/2023 1:46 PM MRN: [id number] Endoscopist: Landon Pinion P. General Kenner , MD, [id number] Age: 53 Referring MD:  Date of Birth: 01-10-1971 Gender: Female Account #: [id number] Procedure:                Colonoscopy Indications:              Rectal bleeding - chronic constipation improved                            with Linzess  Medicines:                Monitored Anesthesia Care Procedure:                Pre-Anesthesia Assessment:                           - Prior to the procedure, a History and Physical                            was performed, and patient medications and                            allergies were reviewed. The patient's tolerance of                            previous anesthesia was also reviewed. The risks                            and benefits of the procedure and the sedation                            options and risks were discussed with the patient.                            All questions were answered, and informed consent                            was obtained. Prior Anticoagulants: The patient has                            taken no anticoagulant or antiplatelet agents. ASA                            Grade Assessment: II - A patient with mild systemic                            disease. After reviewing the risks and benefits,                            the patient was deemed in satisfactory condition to                            undergo the procedure.  After obtaining informed consent, the colonoscope                            was passed under direct vision. Throughout the                            procedure, the patient's blood pressure, pulse, and                            oxygen saturations were monitored continuously. The                            Olympus Scope SN: [id number] was introduced through                            the anus and advanced to the the  terminal ileum,                            with identification of the appendiceal orifice and                            IC valve. The colonoscopy was performed without                            difficulty. The patient tolerated the procedure                            well. The quality of the bowel preparation was                            adequate. The terminal ileum, ileocecal valve,                            appendiceal orifice, and rectum were photographed. Scope In: 2:02:07 PM Scope Out: 2:39:55 PM Scope Withdrawal Time: 0 hours 30 minutes 41 seconds  Total Procedure Duration: 0 hours 37 minutes 48 seconds  Findings:                 The perianal and digital rectal examinations were                            normal.                           The terminal ileum appeared normal.                           A 4 to 5 mm polyp was found in the transverse                            colon. The polyp was sessile. The polyp was removed                            with a cold snare. Resection and retrieval were  complete.                           A large polyp was found in the distal transverse to                            splenic flexure. The polyp was sessile. Suspect 30                            to 35mm. The polyp was removed with a piecemeal                            technique using a cold snare. Resection and                            retrieval were complete. There was pooling of                            stool, residual seeds, and spasm in the area which                            made it time consuming to removed. Area just distal                            to this was tattooed with an injection of Spot                            (carbon black).                           Hemorrhoids were found during retroflexion.                           A localized area of ulcerated mucosa was found in                            the distal rectum near hemorrhoids. I  suspect this                            may reflect prolapse change, but a few biopsies                            were taken with a cold forceps for histology.                           The exam was otherwise without abnormality. The                            prep was overall adequate however significant                            residual seeds led to clogging of the scope and  prolonged the exam. Complications:            No immediate complications. Estimated blood loss:                            Minimal. Estimated Blood Loss:     Estimated blood loss was minimal. Impression:               - The examined portion of the ileum was normal.                           - One 4 to 5 mm polyp in the transverse colon,                            removed with a cold snare. Resected and retrieved.                           - One large polyp at the splenic flexure, removed                            piecemeal using a cold snare. Resected and                            retrieved. Tattooed.                           - Hemorrhoids.                           - Ulcerated mucosa in the distal rectum along                            hemorrhoids as outlined. Biopsied.                           - The examination was otherwise normal.                           Bleeding is due to hemorrhoids / ulcerated area of                            the rectum. Recommendation:           - Patient has a contact number available for                            emergencies. The signs and symptoms of potential                            delayed complications were discussed with the                            patient. Return to normal activities tomorrow.                            Written discharge instructions were provided to the  patient.                           - Resume previous diet.                           - Continue present medications.                            - Continue Linzess  for constipation                           - Trial of Calmol4 suppository                           - Await pathology results. Anticipate repeat                            colonoscopy in 6 months or so to evaluate                            polypectomy site.                           - Consider referral to colorectal surgery for                            anoscopy / evaluation prior to consideration for                            hemorrhoid banding                           - Of note, recommend sleep study to rule out OSA                            given pattern of breathing noted during this exam. Lendon Queen. General Kenner, MD 08/30/2023 2:51:28 PM This report has been signed electronically.

## 2023-08-30 NOTE — Progress Notes (Signed)
 Report given to PACU, vss

## 2023-08-30 NOTE — Progress Notes (Signed)
VS by DT    

## 2023-08-30 NOTE — Telephone Encounter (Signed)
 Patient called and stated that she was told to call our office to request a prescription to be sent over for Linzess  lower dosage. Patient is requesting her Linzess  be sent to the [facility] on [address]. Patient is also requesting a call back to inform her the medication has been sent over to her pharmacy. Please advise.

## 2023-08-30 NOTE — Progress Notes (Signed)
 Called to room to assist during endoscopic procedure.  Patient ID and intended procedure confirmed with present staff. Received instructions for my participation in the procedure from the performing physician.

## 2023-08-30 NOTE — Patient Instructions (Signed)
 Please read handouts provided. Continue present medications. Await pathology results. Continue Linzess  for constipation. Recommend sleep study to rule out OSA. Trial of Calmol4 suppository.  YOU HAD AN ENDOSCOPIC PROCEDURE TODAY AT THE [facility]:   Refer to the procedure report that was given to you for any specific questions about what was found during the examination.  If the procedure report does not answer your questions, please call your gastroenterologist to clarify.  If you requested that your care partner not be given the details of your procedure findings, then the procedure report has been included in a sealed envelope for you to review at your convenience later.  YOU SHOULD EXPECT: Some feelings of bloating in the abdomen. Passage of more gas than usual.  Walking can help get rid of the air that was put into your GI tract during the procedure and reduce the bloating. If you had a lower endoscopy (such as a colonoscopy or flexible sigmoidoscopy) you may notice spotting of blood in your stool or on the toilet paper. If you underwent a bowel prep for your procedure, you may not have a normal bowel movement for a few days.  Please Note:  You might notice some irritation and congestion in your nose or some drainage.  This is from the oxygen used during your procedure.  There is no need for concern and it should clear up in a day or so.  SYMPTOMS TO REPORT IMMEDIATELY:  Following lower endoscopy (colonoscopy or flexible sigmoidoscopy):  Excessive amounts of blood in the stool  Significant tenderness or worsening of abdominal pains  Swelling of the abdomen that is new, acute  Fever of 100F or higher.  For urgent or emergent issues, a gastroenterologist can be reached at any hour by calling [phone]. Do not use MyChart messaging for urgent concerns.    DIET:  We do recommend a small meal at first, but then you may proceed to your regular diet.  Drink plenty of fluids  but you should avoid alcoholic beverages for 24 hours.  ACTIVITY:  You should plan to take it easy for the rest of today and you should NOT DRIVE or use heavy machinery until tomorrow (because of the sedation medicines used during the test).    FOLLOW UP: Our staff will call the number listed on your records the next business day following your procedure.  We will call around 7:15- 8:00 am to check on you and address any questions or concerns that you may have regarding the information given to you following your procedure. If we do not reach you, we will leave a message.     If any biopsies were taken you will be contacted by phone or by letter within the next 1-3 weeks.  Please call us  at [phone] if you have not heard about the biopsies in 3 weeks.    SIGNATURES/CONFIDENTIALITY: You and/or your care partner have signed paperwork which will be entered into your electronic medical record.  These signatures attest to the fact that that the information above on your After Visit Summary has been reviewed and is understood.  Full responsibility of the confidentiality of this discharge information lies with you and/or your care-partner.

## 2023-08-31 ENCOUNTER — Telehealth: Payer: Self-pay

## 2023-08-31 ENCOUNTER — Other Ambulatory Visit: Payer: Self-pay

## 2023-08-31 DIAGNOSIS — K5904 Chronic idiopathic constipation: Secondary | ICD-10-CM

## 2023-08-31 MED ORDER — LINACLOTIDE 72 MCG PO CAPS: 72.0000 ug | ORAL_CAPSULE | Freq: Every day | ORAL | 3 refills | Status: AC

## 2023-08-31 NOTE — Telephone Encounter (Signed)
  Follow up Call-     08/30/2023    1:43 PM  Call back number  Post procedure Call Back phone  # [phone]  Permission to leave phone message Yes     Left message

## 2023-08-31 NOTE — Telephone Encounter (Signed)
 Contacted patient and she stated that she would like Linzess  72 mcg sent to [facility] in [place] , [place]. Prescription sent to patients preferred pharmacy.

## 2023-09-04 LAB — SURGICAL PATHOLOGY

## 2023-09-05 ENCOUNTER — Ambulatory Visit: Payer: Self-pay | Admitting: Gastroenterology

## 2023-09-05 DIAGNOSIS — R069 Unspecified abnormalities of breathing: Secondary | ICD-10-CM

## 2023-10-09 ENCOUNTER — Ambulatory Visit: Admitting: Gastroenterology

## 2023-10-09 NOTE — Progress Notes (Deleted)
 HPI :  53 y.o. y/o female referred for consultation & management  by [name], MD.     Patient last saw Dr. Leigh 05/2018 to evaluate rectal bleeding and hemorrhoids.  Colonoscopy was recommended, however patient did not follow through.  Anoscopy showed grade 2 internal hemorrhoids.  Treated with Anusol  suppositories.   No previous colonoscopy.  Her paternal grandfather had colon cancer.   Current symptoms: Patient states she is having worsening constipation since December 2024.  She tried OTC MiraLAX with little benefit.  Most recently she took magnesium citrate which is helping a little.  She states she has not had a good bowel movement in the past month.  She has had a few small hard bowel movements in the past few days.  She has a lot of straining and hard stools.  She has noticed bright red blood on the tissue and in the toilet after straining to have a bowel movement.  She attributes this to hemorrhoids.  She has generalized diffuse abdominal discomfort and feels full.  Worse in the right upper quadrant.  Has mild nausea but no vomiting.  Denies weight loss.  She has not been drinking many fluids or eating much fiber in her diet in the past 6 months.  53 y.o. y/o female has been referred for:   1.  Constipation / Generalized Abdominal Pain -Labs: CBC, CMP, Lipase, TSH -Abdominal Xray (KUB): Evaluate Stool Burden -Gave samples of Linzess  145 mcg QD for 1 week, then 290 mcg QD for 1 week.  She will let me know which dose works best, and then we can send a prescription.  -Recommend High Fiber diet with fruits, vegetables, and whole grains. -Drink 64 ounces of Fluids Daily.   2.  Hemorrhoids -Rx Hydrocortisone  2.5% Cream, apply 2-3 times daily as needed.   3.  Rectal bleeding             Scheduling Colonoscopy I discussed risks of colonoscopy with patient to include risk of bleeding, colon perforation, and risk of sedation.  Patient expressed understanding and agrees to  proceed with colonoscopy.    4.  Colon cancer screening - No previous Colonoscopy             Scheduling Colonoscopy     Colonoscopy 08/30/23: - The perianal and digital rectal examinations were normal. - The terminal ileum appeared normal. - A 4 to 5 mm polyp was found in the transverse colon. The polyp was sessile. The polyp was removed with a cold snare. Resection and retrieval were complete. - A large polyp was found in the distal transverse to splenic flexure. The polyp was sessile. Suspect 30 to 35mm. The polyp was removed with a piecemeal technique using a cold snare. Resection and retrieval were complete. There was pooling of stool, residual seeds, and spasm in the area which made it time consuming to removed. Area just distal to this was tattooed with an injection of Spot (carbon black). - Hemorrhoids were found during retroflexion. - A localized area of ulcerated mucosa was found in the distal rectum near hemorrhoids. I suspect this may reflect prolapse change, but a few biopsies were taken with a cold forceps for histology. - The exam was otherwise without abnormality. The prep was overall adequate however significant residual seeds led to clogging of the scope and prolonged the exam.  - Continue Linzess  for constipation - Trial of Calmol4 suppository - Await pathology results. Anticipate repeat colonoscopy in 6 months or so to  evaluate polypectomy site. - Consider referral to colorectal surgery for anoscopy / evaluation prior to consideration for hemorrhoid banding - Of note, recommend sleep study to rule out OSA given pattern of breathing noted during this exam.  FINAL DIAGNOSIS        1. Surgical [P], colon, transverse, polyp (1) :       TUBULAR ADENOMA.       NEGATIVE FOR HIGH-GRADE DYSPLASIA.        2. Surgical [P], colon, splenic flexure, polyp (1) :       TUBULAR ADENOMA.       NEGATIVE FOR HIGH-GRADE DYSPLASIA.        3. Surgical [P], colon, rectum hemorrhoidal ulcer :        PREDOMINANT PURULENT EXUDATES.       NO COLONIC MUCOSA IDENTIFIED FOR EVALUATION.     he polyps that I removed from your colon were proven to be adenomatous.  These are considered to be pre-cancerous polyps that may have grown into cancers if they had not been removed.  One of these polyps was considered to be large, and because of the size of it could not be removed in 1 piece and took multiple passes for removal.  While I believe this was likely entirely removed, I recommend a repeat colonoscopy in 6 months to ensure there is no residual or current polyp tissue at the site.  If that exam is normal then you would next be due for an exam in 3 years.   Otherwise, your bleeding is due to hemorrhoids in the setting of constipation.  You had inflamed hemorrhoids on this exam as well as a small ulceration in your rectal area.  Biopsies showed only inflammation without precancerous change.  I recommend you continue the Linzess  and have recommended you also add Calmol 4 suppositories to help treat the inflammation.  I would like to see you back in the office in the upcoming weeks to reassess the area and ensure this is healing.  If you have persistent ulceration there we may consider having you seen by a colorectal surgeon.   Of note, the breathing pattern observed under anesthesia was concerning for possible underlying sleep apnea.  I recommend a sleep study to assess for sleep apnea.  I am happy to place that referral for you if you would like.   I will have our staff reach out to you to coordinate follow-up with us  and to see if you want the referral for the sleep study.   If you have any questions about this recommendation otherwise, please do not hesitate to contact me.        Past Medical History:  Diagnosis Date   ADD (attention deficit disorder)    Anxiety    Depression    GERD (gastroesophageal reflux disease)    Wears contact lenses      Past Surgical History:  Procedure Laterality  Date   CERVICAL ABLATION  2017   CESAREAN SECTION  2006   SHOULDER ACROMIOPLASTY Left 06/24/2014   Procedure: SHOULDER ACROMIOPLASTY;  Surgeon: Toribio Silos, MD;  Location: [facility];  Service: Orthopedics;  Laterality: Left;   SHOULDER ARTHROSCOPY Left 06/24/2014   Procedure: ARTHROSCOPY LEFT SHOULDER WITH MANIPULATION AND LYSIS OF ADHESIONS;  Surgeon: Toribio Silos, MD;  Location: [facility];  Service: Orthopedics;  Laterality: Left;   WISDOM TOOTH EXTRACTION     Family History  Problem Relation Age of Onset   Hyperlipidemia Mother  Diabetes Father    Breast cancer Maternal Grandmother    Diabetes Maternal Grandfather    Stroke Maternal Grandfather    Parkinson's disease Paternal Grandmother    Colon cancer Paternal Grandfather    Heart disease Paternal Grandfather    Esophageal cancer Neg Hx    Stomach cancer Neg Hx    Rectal cancer Neg Hx    Social History   Tobacco Use   Smoking status: Every Day    Current packs/day: 0.00    Types: Cigarettes    Last attempt to quit: 06/19/1998    Years since quitting: 25.3   Smokeless tobacco: Never   Tobacco comments:    quit 11  years ago  Vaping Use   Vaping status: Never Used  Substance Use Topics   Alcohol use: Yes    Comment: 2-3 times per week   Drug use: Yes    Types: Marijuana    Comment: occ   Current Outpatient Medications  Medication Sig Dispense Refill   linaclotide  (LINZESS ) 72 MCG capsule Take 1 capsule (72 mcg total) by mouth daily before breakfast. 30 capsule 3   amphetamine-dextroamphetamine (ADDERALL) 10 MG tablet Take 20 mg by mouth 2 (two) times daily with a meal.     buPROPion  (WELLBUTRIN  SR) 150 MG 12 hr tablet Take 150 mg by mouth daily.     Chlorpheniramine-PSE-Ibuprofen (ADVIL ALLERGY SINUS PO) Take 1 tablet by mouth as needed.     DULoxetine (CYMBALTA) 60 MG capsule Take 60 mg by mouth daily.     ibuprofen (ADVIL,MOTRIN) 200 MG tablet Take 400-600 mg by mouth every 6  (six) hours as needed for headache, mild pain or moderate pain.     Current Facility-Administered Medications  Medication Dose Route Frequency Provider Last Rate Last Admin   0.9 %  sodium chloride  infusion  500 mL Intravenous Once Stephaney , Elspeth SQUIBB, MD       No Known Allergies   Review of Systems: All systems reviewed and negative except where noted in HPI.    No results found.  Physical Exam: There were no vitals taken for this visit. Constitutional: Pleasant,well-developed, ***female in no acute distress. HEENT: Normocephalic and atraumatic. Conjunctivae are normal. No scleral icterus. Neck supple.  Cardiovascular: Normal rate, regular rhythm.  Pulmonary/chest: Effort normal and breath sounds normal. No wheezing, rales or rhonchi. Abdominal: Soft, nondistended, nontender. Bowel sounds active throughout. There are no masses palpable. No hepatomegaly. Extremities: no edema Lymphadenopathy: No cervical adenopathy noted. Neurological: Alert and oriented to person place and time. Skin: Skin is warm and dry. No rashes noted. Psychiatric: Normal mood and affect. Behavior is normal.   ASSESSMENT: 53 y.o. female here for assessment of the following  No diagnosis found.  PLAN:   [name], MD

## 2023-12-04 ENCOUNTER — Other Ambulatory Visit: Payer: Self-pay | Admitting: Medical Genetics

## 2023-12-18 ENCOUNTER — Ambulatory Visit: Admitting: Gastroenterology

## 2023-12-18 NOTE — Progress Notes (Deleted)
 HPI :  53 y.o. y/o female referred for consultation & management  by Kristy Clotilda SAUNDERS, MD.     Patient last saw Dr. Leigh 05/2018 to evaluate rectal bleeding and hemorrhoids.  Colonoscopy was recommended, however patient did not follow through.  Anoscopy showed grade 2 internal hemorrhoids.  Treated with Anusol  suppositories.   No previous colonoscopy.  Her paternal grandfather had colon cancer.   Current symptoms: Patient states she is having worsening constipation since December 2024.  She tried OTC MiraLAX with little benefit.  Most recently she took magnesium citrate which is helping a little.  She states she has not had a good bowel movement in the past month.  She has had a few small hard bowel movements in the past few days.  She has a lot of straining and hard stools.  She has noticed bright red blood on the tissue and in the toilet after straining to have a bowel movement.  She attributes this to hemorrhoids.  She has generalized diffuse abdominal discomfort and feels full.  Worse in the right upper quadrant.  Has mild nausea but no vomiting.  Denies weight loss.  She has not been drinking many fluids or eating much fiber in her diet in the past 6 months.  Kristy [name] is a 53 y.o. y/o female has been referred for:   1.  Constipation / Generalized Abdominal Pain -Labs: CBC, CMP, Lipase, TSH -Abdominal Xray (KUB): Evaluate Stool Burden -Gave samples of Linzess  145 mcg QD for 1 week, then 290 mcg QD for 1 week.  She will let me know which dose works best, and then we can send a prescription.  -Recommend High Fiber diet with fruits, vegetables, and whole grains. -Drink 64 ounces of Fluids Daily.   2.  Hemorrhoids -Rx Hydrocortisone  2.5% Cream, apply 2-3 times daily as needed.   3.  Rectal bleeding             Scheduling Colonoscopy I discussed risks of colonoscopy with patient to include risk of bleeding, colon perforation, and risk of sedation.  Patient expressed  understanding and agrees to proceed with colonoscopy.    4.  Colon cancer screening - No previous Colonoscopy             Scheduling Colonoscopy     Colonoscopy 08/30/23: - The perianal and digital rectal examinations were normal. - The terminal ileum appeared normal. - A 4 to 5 mm polyp was found in the transverse colon. The polyp was sessile. The polyp was removed with a cold snare. Resection and retrieval were complete. - A large polyp was found in the distal transverse to splenic flexure. The polyp was sessile. Suspect 30 to 35mm. The polyp was removed with a piecemeal technique using a cold snare. Resection and retrieval were complete. There was pooling of stool, residual seeds, and spasm in the area which made it time consuming to removed. Area just distal to this was tattooed with an injection of Spot (carbon black). - Hemorrhoids were found during retroflexion. - A localized area of ulcerated mucosa was found in the distal rectum near hemorrhoids. I suspect this may reflect prolapse change, but a few biopsies were taken with a cold forceps for histology. - The exam was otherwise without abnormality. The prep was overall adequate however significant residual seeds led to clogging of the scope and prolonged the exam.   - Continue present medications. - Continue Linzess  for constipation - Trial of Calmol4 suppository - Await pathology  results. Anticipate repeat colonoscopy in 6 months or so to evaluate polypectomy site. - Consider referral to colorectal surgery for anoscopy / evaluation prior to consideration for hemorrhoid banding - Of note, recommend sleep study to rule out OSA given pattern of breathing noted during this exam.  FINAL DIAGNOSIS        1. Surgical [P], colon, transverse, polyp (1) :       TUBULAR ADENOMA.       NEGATIVE FOR HIGH-GRADE DYSPLASIA.        2. Surgical [P], colon, splenic flexure, polyp (1) :       TUBULAR ADENOMA.       NEGATIVE FOR HIGH-GRADE DYSPLASIA.         3. Surgical [P], colon, rectum hemorrhoidal ulcer :       PREDOMINANT PURULENT EXUDATES.       NO COLONIC MUCOSA IDENTIFIED FOR EVALUATION.        Past Medical History:  Diagnosis Date   ADD (attention deficit disorder)    Anxiety    Depression    GERD (gastroesophageal reflux disease)    Wears contact lenses      Past Surgical History:  Procedure Laterality Date   CERVICAL ABLATION  2017   CESAREAN SECTION  2006   SHOULDER ACROMIOPLASTY Left 06/24/2014   Procedure: SHOULDER ACROMIOPLASTY;  Surgeon: Toribio Silos, MD;  Location: [facility];  Service: Orthopedics;  Laterality: Left;   SHOULDER ARTHROSCOPY Left 06/24/2014   Procedure: ARTHROSCOPY LEFT SHOULDER WITH MANIPULATION AND LYSIS OF ADHESIONS;  Surgeon: Toribio Silos, MD;  Location: [facility];  Service: Orthopedics;  Laterality: Left;   WISDOM TOOTH EXTRACTION     Family History  Problem Relation Age of Onset   Hyperlipidemia Mother    Diabetes Father    Breast cancer Maternal Grandmother    Diabetes Maternal Grandfather    Stroke Maternal Grandfather    Parkinson's disease Paternal Grandmother    Colon cancer Paternal Grandfather    Heart disease Paternal Grandfather    Esophageal cancer Neg Hx    Stomach cancer Neg Hx    Rectal cancer Neg Hx    Social History   Tobacco Use   Smoking status: Every Day    Current packs/day: 0.00    Types: Cigarettes    Last attempt to quit: 06/19/1998    Years since quitting: 25.5   Smokeless tobacco: Never   Tobacco comments:    quit 11  years ago  Vaping Use   Vaping status: Never Used  Substance Use Topics   Alcohol use: Yes    Comment: 2-3 times per week   Drug use: Yes    Types: Marijuana    Comment: occ   Current Outpatient Medications  Medication Sig Dispense Refill   linaclotide  (LINZESS ) 72 MCG capsule Take 1 capsule (72 mcg total) by mouth daily before breakfast. 30 capsule 3   amphetamine-dextroamphetamine (ADDERALL) 10  MG tablet Take 20 mg by mouth 2 (two) times daily with a meal.     buPROPion  (WELLBUTRIN  SR) 150 MG 12 hr tablet Take 150 mg by mouth daily.     Chlorpheniramine-PSE-Ibuprofen (ADVIL ALLERGY SINUS PO) Take 1 tablet by mouth as needed.     DULoxetine (CYMBALTA) 60 MG capsule Take 60 mg by mouth daily.     ibuprofen (ADVIL,MOTRIN) 200 MG tablet Take 400-600 mg by mouth every 6 (six) hours as needed for headache, mild pain or moderate pain.     Current Facility-Administered  Medications  Medication Dose Route Frequency Provider Last Rate Last Admin   0.9 %  sodium chloride  infusion  500 mL Intravenous Once Kristopher Delk, Elspeth SQUIBB, MD       No Known Allergies   Review of Systems: All systems reviewed and negative except where noted in HPI.    No results found.  Physical Exam: There were no vitals taken for this visit. Constitutional: Pleasant,well-developed, ***female in no acute distress. HEENT: Normocephalic and atraumatic. Conjunctivae are normal. No scleral icterus. Neck supple.  Cardiovascular: Normal rate, regular rhythm.  Pulmonary/chest: Effort normal and breath sounds normal. No wheezing, rales or rhonchi. Abdominal: Soft, nondistended, nontender. Bowel sounds active throughout. There are no masses palpable. No hepatomegaly. Extremities: no edema Lymphadenopathy: No cervical adenopathy noted. Neurological: Alert and oriented to person place and time. Skin: Skin is warm and dry. No rashes noted. Psychiatric: Normal mood and affect. Behavior is normal.   ASSESSMENT: 53 y.o. female here for assessment of the following  No diagnosis found.  PLAN:   Kristy Clotilda SAUNDERS, MD
